# Patient Record
Sex: Female | Born: 1950 | Race: White | Hispanic: No | Marital: Married | Smoking: Never smoker
Health system: Southern US, Community
[De-identification: ages and names within clinical notes are randomized; demographics above are authoritative.]

## PROBLEM LIST (undated history)

## (undated) ENCOUNTER — Emergency Department (HOSPITAL_BASED_OUTPATIENT_CLINIC_OR_DEPARTMENT_OTHER): Admission: EM | Payer: Medicare Other

## (undated) DIAGNOSIS — M1990 Unspecified osteoarthritis, unspecified site: Secondary | ICD-10-CM

## (undated) DIAGNOSIS — I10 Essential (primary) hypertension: Secondary | ICD-10-CM

## (undated) DIAGNOSIS — E039 Hypothyroidism, unspecified: Secondary | ICD-10-CM

## (undated) DIAGNOSIS — K219 Gastro-esophageal reflux disease without esophagitis: Secondary | ICD-10-CM

## (undated) DIAGNOSIS — E7800 Pure hypercholesterolemia, unspecified: Secondary | ICD-10-CM

## (undated) DIAGNOSIS — N189 Chronic kidney disease, unspecified: Secondary | ICD-10-CM

## (undated) DIAGNOSIS — F32A Depression, unspecified: Secondary | ICD-10-CM

## (undated) DIAGNOSIS — K589 Irritable bowel syndrome without diarrhea: Secondary | ICD-10-CM

## (undated) DIAGNOSIS — F329 Major depressive disorder, single episode, unspecified: Secondary | ICD-10-CM

## (undated) DIAGNOSIS — K449 Diaphragmatic hernia without obstruction or gangrene: Secondary | ICD-10-CM

## (undated) DIAGNOSIS — K760 Fatty (change of) liver, not elsewhere classified: Secondary | ICD-10-CM

## (undated) DIAGNOSIS — K649 Unspecified hemorrhoids: Secondary | ICD-10-CM

## (undated) HISTORY — PX: OTHER SURGICAL HISTORY: SHX169

## (undated) HISTORY — DX: Diaphragmatic hernia without obstruction or gangrene: K44.9

---

## 1998-02-17 ENCOUNTER — Ambulatory Visit (HOSPITAL_COMMUNITY): Admission: RE | Admit: 1998-02-17 | Discharge: 1998-02-17 | Payer: Self-pay | Admitting: *Deleted

## 1998-02-17 ENCOUNTER — Encounter: Payer: Self-pay | Admitting: Family Medicine

## 1999-02-10 ENCOUNTER — Encounter: Payer: Self-pay | Admitting: *Deleted

## 1999-02-10 ENCOUNTER — Ambulatory Visit (HOSPITAL_COMMUNITY): Admission: RE | Admit: 1999-02-10 | Discharge: 1999-02-10 | Payer: Self-pay | Admitting: *Deleted

## 1999-08-18 ENCOUNTER — Ambulatory Visit (HOSPITAL_COMMUNITY): Admission: RE | Admit: 1999-08-18 | Discharge: 1999-08-18 | Payer: Self-pay | Admitting: Family Medicine

## 1999-08-18 ENCOUNTER — Encounter: Payer: Self-pay | Admitting: Family Medicine

## 1999-12-06 ENCOUNTER — Ambulatory Visit (HOSPITAL_BASED_OUTPATIENT_CLINIC_OR_DEPARTMENT_OTHER): Admission: RE | Admit: 1999-12-06 | Discharge: 1999-12-06 | Payer: Self-pay | Admitting: Orthopaedic Surgery

## 2000-03-16 ENCOUNTER — Observation Stay (HOSPITAL_COMMUNITY): Admission: EM | Admit: 2000-03-16 | Discharge: 2000-03-17 | Payer: Self-pay | Admitting: Podiatry

## 2000-03-17 ENCOUNTER — Encounter: Payer: Self-pay | Admitting: Internal Medicine

## 2003-02-05 ENCOUNTER — Ambulatory Visit (HOSPITAL_COMMUNITY): Admission: RE | Admit: 2003-02-05 | Discharge: 2003-02-05 | Payer: Self-pay | Admitting: Family Medicine

## 2003-03-26 ENCOUNTER — Encounter
Admission: RE | Admit: 2003-03-26 | Discharge: 2003-06-24 | Payer: Self-pay | Admitting: Physical Medicine & Rehabilitation

## 2003-04-10 ENCOUNTER — Encounter
Admission: RE | Admit: 2003-04-10 | Discharge: 2003-04-10 | Payer: Self-pay | Admitting: Physical Medicine & Rehabilitation

## 2003-07-23 ENCOUNTER — Other Ambulatory Visit: Admission: RE | Admit: 2003-07-23 | Discharge: 2003-07-23 | Payer: Self-pay | Admitting: Family Medicine

## 2005-01-12 ENCOUNTER — Other Ambulatory Visit: Admission: RE | Admit: 2005-01-12 | Discharge: 2005-01-12 | Payer: Self-pay | Admitting: Family Medicine

## 2007-09-23 ENCOUNTER — Emergency Department (HOSPITAL_BASED_OUTPATIENT_CLINIC_OR_DEPARTMENT_OTHER): Admission: EM | Admit: 2007-09-23 | Discharge: 2007-09-23 | Payer: Self-pay | Admitting: Emergency Medicine

## 2007-10-21 DIAGNOSIS — N12 Tubulo-interstitial nephritis, not specified as acute or chronic: Secondary | ICD-10-CM | POA: Insufficient documentation

## 2007-10-21 DIAGNOSIS — E7800 Pure hypercholesterolemia, unspecified: Secondary | ICD-10-CM | POA: Insufficient documentation

## 2007-10-21 DIAGNOSIS — K7689 Other specified diseases of liver: Secondary | ICD-10-CM | POA: Insufficient documentation

## 2007-10-21 DIAGNOSIS — I10 Essential (primary) hypertension: Secondary | ICD-10-CM

## 2007-10-21 DIAGNOSIS — E039 Hypothyroidism, unspecified: Secondary | ICD-10-CM | POA: Insufficient documentation

## 2007-10-21 DIAGNOSIS — R109 Unspecified abdominal pain: Secondary | ICD-10-CM | POA: Insufficient documentation

## 2007-10-22 ENCOUNTER — Ambulatory Visit: Payer: Self-pay | Admitting: Gastroenterology

## 2007-10-22 DIAGNOSIS — K589 Irritable bowel syndrome without diarrhea: Secondary | ICD-10-CM

## 2007-10-22 DIAGNOSIS — K219 Gastro-esophageal reflux disease without esophagitis: Secondary | ICD-10-CM

## 2007-10-22 DIAGNOSIS — R1011 Right upper quadrant pain: Secondary | ICD-10-CM | POA: Insufficient documentation

## 2007-10-22 DIAGNOSIS — R079 Chest pain, unspecified: Secondary | ICD-10-CM

## 2007-10-22 DIAGNOSIS — F4323 Adjustment disorder with mixed anxiety and depressed mood: Secondary | ICD-10-CM | POA: Insufficient documentation

## 2007-10-22 LAB — CONVERTED CEMR LAB: Tissue Transglutaminase Ab, IgA: 1.3 units (ref <7)

## 2007-10-23 ENCOUNTER — Telehealth: Payer: Self-pay | Admitting: Gastroenterology

## 2007-10-23 LAB — CONVERTED CEMR LAB
Folate: 8.8 ng/mL
TSH: 5.68 microintl units/mL — ABNORMAL HIGH (ref 0.35–5.50)
Vitamin B-12: 99 pg/mL — ABNORMAL LOW (ref 211–911)

## 2007-10-25 ENCOUNTER — Ambulatory Visit: Payer: Self-pay | Admitting: Gastroenterology

## 2007-10-25 DIAGNOSIS — E538 Deficiency of other specified B group vitamins: Secondary | ICD-10-CM | POA: Insufficient documentation

## 2007-10-30 ENCOUNTER — Ambulatory Visit: Payer: Self-pay | Admitting: Gastroenterology

## 2007-10-30 ENCOUNTER — Encounter: Payer: Self-pay | Admitting: Gastroenterology

## 2007-10-30 LAB — CONVERTED CEMR LAB: UREASE: NEGATIVE

## 2007-11-01 ENCOUNTER — Ambulatory Visit: Payer: Self-pay | Admitting: Gastroenterology

## 2007-11-05 ENCOUNTER — Encounter: Payer: Self-pay | Admitting: Gastroenterology

## 2007-11-08 ENCOUNTER — Ambulatory Visit: Payer: Self-pay | Admitting: Gastroenterology

## 2007-11-12 ENCOUNTER — Ambulatory Visit: Payer: Self-pay | Admitting: Gastroenterology

## 2007-12-13 ENCOUNTER — Ambulatory Visit: Payer: Self-pay | Admitting: Gastroenterology

## 2008-01-10 ENCOUNTER — Ambulatory Visit: Payer: Self-pay | Admitting: Gastroenterology

## 2008-02-06 ENCOUNTER — Ambulatory Visit: Payer: Self-pay | Admitting: Gastroenterology

## 2008-02-06 LAB — CONVERTED CEMR LAB
Folate: 5.5 ng/mL
Vitamin B-12: >1500 pg/mL — ABNORMAL HIGH (ref 211–911)

## 2009-08-17 ENCOUNTER — Ambulatory Visit: Payer: Self-pay | Admitting: Gastroenterology

## 2009-08-17 DIAGNOSIS — K625 Hemorrhage of anus and rectum: Secondary | ICD-10-CM

## 2009-08-17 DIAGNOSIS — K648 Other hemorrhoids: Secondary | ICD-10-CM | POA: Insufficient documentation

## 2009-08-18 LAB — CONVERTED CEMR LAB
Basophils Absolute: 0 10*3/uL (ref 0.0–0.1)
Basophils Relative: 0.3 % (ref 0.0–3.0)
Eosinophils Relative: 0.2 % (ref 0.0–5.0)
Folate: 11.7 ng/mL
HCT: 41.4 % (ref 36.0–46.0)
Hemoglobin: 14.2 g/dL (ref 12.0–15.0)
Iron: 105 ug/dL (ref 42–145)
Lymphocytes Relative: 24.5 % (ref 12.0–46.0)
Lymphs Abs: 1.9 10*3/uL (ref 0.7–4.0)
Monocytes Relative: 6.3 % (ref 3.0–12.0)
Neutro Abs: 5.2 10*3/uL (ref 1.4–7.7)
RBC: 4.48 M/uL (ref 3.87–5.11)
Saturation Ratios: 30.2 % (ref 20.0–50.0)
Transferrin: 248.3 mg/dL (ref 212.0–360.0)
WBC: 7.6 10*3/uL (ref 4.5–10.5)

## 2009-09-28 ENCOUNTER — Telehealth: Payer: Self-pay | Admitting: Gastroenterology

## 2010-03-01 NOTE — Progress Notes (Signed)
Summary: triage  Phone Note Call from Patient Call back at [phone]   Caller: Patient Call For: Dr. Jarold Motto Reason for Call: Talk to Nurse Summary of Call: hemorrhoids have worsened per pt and she would like to know what she should do Initial call taken by: Vallarie Mare,  September 28, 2009 2:02 PM  Follow-up for Phone Call        Pt was seen one month ago re hemorrhoid.  She has tried sitz baths and ananmantle cream.  No improvement noted.  Cont's to have rectal bleeding.  A few days ago passed alot of bright red blood.  Asking what else she can do? Follow-up by: Ashok Cordia RN,  September 28, 2009 2:34 PM    Additional Follow-up for Phone Call Additional follow up Details #2::    Canasa 1gm supp two times a day and schedule colon exam....phillip's colon health two times a day also Follow-up by: Mardella Layman MD Clementeen Graham,  September 28, 2009 3:15 PM  Additional Follow-up for Phone Call Additional follow up Details #3:: Details for Additional Follow-up Action Taken: LM for pt to call.  Lupita Leash Surface RN  September 29, 2009 9:18 AM   Pt notified.  She is going to try the canasa and colon health.  She will call back to sch colonoscopy if no improvement.  Just had conon in 2009, Additional Follow-up by: Ashok Cordia RN,  September 29, 2009 9:32 AM  New/Updated Medications: CANASA 1000 MG  SUPP (MESALAMINE) 1 per rectum two times a day PHILLIPS COLON HEALTH  CAPS (PROBIOTIC PRODUCT) two times a day Prescriptions: CANASA 1000 MG  SUPP (MESALAMINE) 1 per rectum two times a day  #30 x 3   Entered by:   Ashok Cordia RN   Authorized by:   Mardella Layman MD [facility]   Signed by:   Ashok Cordia RN on 09/29/2009   Method used:   [facility]  [place] [address]       [address] [place] [address]       [place], [place]  [address]       Ph: [phone] or [phone]       Fax: [phone]   RxID:   [id number]

## 2010-03-01 NOTE — Assessment & Plan Note (Signed)
Summary: hemorroids--ch.   History of Present Illness Visit Type: Follow-up Visit Primary GI MD: Sheryn Bison MD FACP FAGA Primary Provider: Lajuana Matte, [place] Chief Complaint: hemorrhoidal flare, inflamed & bleeding History of Present Illness:   Periodic rectal bleeding and rectal discomfort or palpated hemorrhoids for several years. Last colonoscopy was 2 years ago and was otherwise unremarkable. Patient voluntarily lost 80 pounds of weight and denies other gastrointestinal issues. Review of her lab shows previous iron deficiency, she has no symptoms of anemia.   GI Review of Systems      Denies abdominal pain, acid reflux, belching, bloating, chest pain, dysphagia with liquids, dysphagia with solids, heartburn, loss of appetite, nausea, vomiting, vomiting blood, weight loss, and  weight gain.      Reports hemorrhoids, rectal bleeding, and  rectal pain.     Denies anal fissure, black tarry stools, change in bowel habit, constipation, diarrhea, diverticulosis, fecal incontinence, heme positive stool, irritable bowel syndrome, jaundice, light color stool, and  liver problems.    Current Medications (verified): 1)  Lisinopril-Hydrochlorothiazide 10-12.5 Mg Tabs (Lisinopril-Hydrochlorothiazide) .... Once Daily 2)  Levothyroxine Sodium 50 Mcg Tabs (Levothyroxine Sodium) .... Once Daily 3)  Zoloft 50 Mg Tabs (Sertraline Hcl) .... One By Mouth Once Daily 4)  Celebrex 200 Mg Caps (Celecoxib) .... Take One By Mouth Two Times A Day  Allergies (verified): No Known Drug Allergies  Past History:  Past medical, surgical, family and social histories (including risk factors) reviewed for relevance to current acute and chronic problems.  Past Medical History: Reviewed history from 10/21/2007 and no changes required. Current Problems:  FATTY LIVER DISEASE (ICD-571.8) HYPOTHYROIDISM (ICD-244.9) HYPERCHOLESTEROLEMIA (ICD-272.0) HYPERTENSION (ICD-401.9) PYELONEPHRITIS (ICD-590.80) ABDOMINAL  PAIN (ICD-789.00)  Past Surgical History: Reviewed history from 10/22/2007 and no changes required. Anti-reflux surgery in 1999 at [facility] surgery. These records are not available at this time for review  Family History: Reviewed history from 02/06/2008 and no changes required. Family History of Heart Disease:  Father Family History of Diabetes: Mother Stroke: Stefanie Libel, Mother  Social History: Reviewed history from 10/22/2007 and no changes required. Patient has never smoked.  Alcohol Use - no Illicit Drug Use - no Occupation: Publishing copy Area Patient does not get regular exercise.  Daily Caffeine Use  Review of Systems  The patient denies allergy/sinus, anemia, anxiety-new, arthritis/joint pain, back pain, blood in urine, breast changes/lumps, change in vision, confusion, cough, coughing up blood, depression-new, fainting, fatigue, fever, headaches-new, hearing problems, heart murmur, heart rhythm changes, itching, menstrual pain, muscle pains/cramps, night sweats, nosebleeds, pregnancy symptoms, shortness of breath, skin rash, sleeping problems, sore throat, swelling of feet/legs, swollen lymph glands, thirst - excessive , urination - excessive , urination changes/pain, urine leakage, vision changes, and voice change.   General:  Denies fever, chills, sweats, anorexia, fatigue, weakness, malaise, weight loss, and sleep disorder. GI:  Denies difficulty swallowing, pain on swallowing, nausea, indigestion/heartburn, vomiting, vomiting blood, abdominal pain, jaundice, gas/bloating, diarrhea, constipation, change in bowel habits, bloody BM's, black BMs, and fecal incontinence. Psych:  Denies depression, anxiety, memory loss, suicidal ideation, hallucinations, paranoia, phobia, and confusion; she That is on Zoloft 50 mg a day.. Heme:  Denies bruising, bleeding, enlarged lymph nodes, and pagophagia.  Vital Signs:  Patient profile:   60 year old  female Height:      66 inches Weight:      155.38 pounds BMI:     25.17 Pulse rate:   68 / minute Pulse rhythm:   regular BP sitting:  114 / 78  (right arm) Cuff size:   regular  Vitals Entered By: June McMurray CMA Duncan Dull) (August 17, 2009 3:36 PM)  Physical Exam  General:  Well developed, well nourished, no acute distress.healthy appearing.   Head:  Normocephalic and atraumatic. Eyes:  PERRLA, no icterus.exam deferred to patient's ophthalmologist.   Abdomen:  Soft, nontender and nondistended. No masses, hepatosplenomegaly or hernias noted. Normal bowel sounds. Rectal:  Normal exam.hemocult negative.   Extremities:  No clubbing, cyanosis, edema or deformities noted. Neurologic:  Alert and  oriented x4;  grossly normal neurologically. Psych:  Alert and cooperative. Normal mood and affect.   Impression & Recommendations:  Problem # 1:  HEMORRHOIDS, INTERNAL (ICD-455.0) Assessment Unchanged p.r.n. Sitz baths and local Analmantle cream as needed with daily Benefiber as tolerated.  Problem # 2:  B12 DEFICIENCY (ICD-266.2) Assessment: Improved repeat CBC and anemia profile.  Problem # 3:  IRRITABLE BOWEL SYNDROME (ICD-564.1) Assessment: Improved She is up-to-date on her colonoscopy exams.  Other Orders: TLB-CBC Platelet - w/Differential (85025-CBCD) TLB-B12, Serum-Total ONLY (16109-U04) TLB-Ferritin (82728-FER) TLB-Folic Acid (Folate) (82746-FOL) TLB-IBC Pnl (Iron/FE;Transferrin) (83550-IBC)  Patient Instructions: 1)  Please go to the basement for lab work. 2)  Begin Benefiber daily. 3)  Begin ananmantle as directed. 4)  The medication list was reviewed and reconciled.  All changed / newly prescribed medications were explained.  A complete medication list was provided to the patient / caregiver. 5)  Copy sent to : Lajuana Matte, PA 6)  Please continue current medications.  7)  Constipation and Hemorrhoids brochure given.   Appended Document: hemorroids--ch.    Clinical  Lists Changes  Medications: Added new medication of ANALPRAM E 2.5-1 & 1 % KIT (HYDROCORTISONE ACE-PRAMOXINE) Apply two times a day as needed - Signed Added new medication of BENEFIBER  PACK (GUAR GUM) daily Rx of ANALPRAM E 2.5-1 & 1 % KIT (HYDROCORTISONE ACE-PRAMOXINE) Apply two times a day as needed;  #30 x 2;  Signed;  Entered by: Ashok Cordia RN;  Authorized by: Mardella Layman MD [facility];  Method used: [facility]  [place] [address], [place], [place]  [address], Ph: [phone] or [phone], Fax: [phone]    Prescriptions: ANALPRAM E 2.5-1 & 1 % KIT (HYDROCORTISONE ACE-PRAMOXINE) Apply two times a day as needed  #30 x 2   Entered by:   Ashok Cordia RN   Authorized by:   Mardella Layman MD [facility]   Signed by:   Ashok Cordia RN on 08/17/2009   Method used:   [facility]        [facility]  [place] [address]       [address]       [place], [place]  [address]       Ph: [phone] or [phone]       Fax: [phone]   RxID:   [id number]

## 2010-05-20 ENCOUNTER — Emergency Department (HOSPITAL_BASED_OUTPATIENT_CLINIC_OR_DEPARTMENT_OTHER)
Admission: EM | Admit: 2010-05-20 | Discharge: 2010-05-20 | Disposition: A | Discharge status: Home / Self Care | Payer: 59 | Attending: Emergency Medicine | Admitting: Emergency Medicine

## 2010-05-20 ENCOUNTER — Emergency Department (INDEPENDENT_AMBULATORY_CARE_PROVIDER_SITE_OTHER): Payer: 59

## 2010-05-20 DIAGNOSIS — E7800 Pure hypercholesterolemia, unspecified: Secondary | ICD-10-CM | POA: Insufficient documentation

## 2010-05-20 DIAGNOSIS — F410 Panic disorder [episodic paroxysmal anxiety] without agoraphobia: Secondary | ICD-10-CM | POA: Insufficient documentation

## 2010-05-20 DIAGNOSIS — R079 Chest pain, unspecified: Secondary | ICD-10-CM

## 2010-05-20 DIAGNOSIS — I10 Essential (primary) hypertension: Secondary | ICD-10-CM | POA: Insufficient documentation

## 2010-05-20 DIAGNOSIS — E039 Hypothyroidism, unspecified: Secondary | ICD-10-CM | POA: Insufficient documentation

## 2010-06-17 NOTE — Assessment & Plan Note (Signed)
HISTORY OF PRESENT ILLNESS:  Karen Horne returns to the clinic today for  follow up evaluation.  She has undergone the MRI scan of her cervical spine  dated from April 10, 2003.  The impression was small left posterolateral and  foraminal protrusion at C5-6.  There were also mild disk bulges at C4-5 and  C6-7 without cord distortion or foraminal encroachment.  The patient reports  that she did have some increased pain of her right arm and shoulder for a  couple of days after her MRI scan.  This may have been secondary to the  immobility and the positioning during the study.  In any event, that has  improved some degree.   The patient was prescribed hydrocodone 5/325 1 tablet q.8h.  Unfortunately,  she misunderstood and has only taken that one tablet a day.  In fact, on  some days, she has used no medications.  She does report that the trazodone  causes severe headaches, and she is not using that medication whatsoever at  this time.  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   MEDICATIONS:  1. Lisinopril/hydrochlorothiazide 20/12.5 1 tablet daily.  2. Hydrocodone 5/325 1 tablet p.o. daily.   PHYSICAL EXAMINATION:  GENERAL APPEARANCE:  Well-appearing adult female.  VITAL SIGNS:  Blood pressure 147/67, pulse 88, O2 saturation 96% on room  air.  EXTREMITIES:  She has strength of 5 minutes/5 throughout the bilateral upper  and lower extremities.  Bulk and tone were normal.  Reflexes were 2+ and  symmetrical.  Sensation was intact to light touch throughout.  The patient  ambulates without any assisted device.   IMPRESSION:  1. Cervical spondylosis per MRI study April 10, 2003.  2. History of bilateral knee arthroscopies with chronic pain.   PLAN:  Unfortunately, the patient was not able to tolerate the trazodone  medications.  She is using no medication for sleep at the present time.  I  have refilled her  Hydrocodone 5/325 1 tablet p.o. q.8h. as of May 01, 2003.  She understands now that she can use the medication up to three times per  day.  We will see how she does over the next few weeks and with future  evaluations of her right shoulder as necessary.      Ellwood Dense, M.D.   DC/MedQ  D:  04/20/2003 15:01:14  T:  04/21/2003 08:49:17  Job #:  [id number]

## 2010-06-17 NOTE — Op Note (Signed)
[name]  Patient:    Karen, Horne                        MRN: [id number] Proc. Date: 12/06/99 Adm. Date:  [id number] Attending:  Marcene Corning                           Operative Report  PREOPERATIVE DIAGNOSIS:  Right knee torn medial meniscus.  POSTOPERATIVE DIAGNOSES: 1. Right knee torn medial meniscus. 2. Right knee chondromalacia of the patella.  PROCEDURES: 1. Right knee partial medial meniscectomy. 2. Right knee chondroplasty, patella and medial femoral condyle.  ANESTHESIA:  Knee block.  SURGEON:  Lubertha Basque. Jerl Santos, M.D.  ASSISTANT:  Prince Rome, P.A.  INDICATION FOR PROCEDURE:  The patient is a 60 year old woman about six or seven months from a successful left knee arthroplasty.  She had similar symptoms on the right with an effusion and medial joint line pain.  Having failed conservative measures, she is offered operative arthroscopy.  The procedure was discussed with the patient, and informed operative consent was obtained after discussion of the possible complications of reaction to anesthesia and infection.  DESCRIPTION OF PROCEDURE:  The patient was taken to the operating suite, where knee block anesthetic was applied without difficulty.  She was positioned supine and prepped and draped in the normal sterile fashion.  After the administration of preoperative IV antibiotics, an arthroscopy of the right knee was performed through a total of two inferior portals.  Suprapatellar pouch was benign, while the patellofemoral joint did exhibit some grade 3 chondromalacia in the groove.  This was addressed with a brief chondroplasty. The patella tracked fairly well, and it was not felt that a lateral release was necessary.  In the medial compartment, she had a degenerative tear of the middle portion of the medial meniscus extending to the posterior horn.  A partial medial meniscectomy was performed back to stable tissues,  removing about 10% of the structure to a stable rim.  She also had some loose cartilage on the medial femoral condyle, which was very soft.  This was debrided, performing a chondroplasty of a nickel-size area back to stable tissues.  No bare bone was exposed.  The lateral compartment was completely benign with no evidence of meniscal or articular cartilage injury.  The ACL and PCL were intact.  The knee joint was thoroughly irrigated at the end of the case, followed by the placement of Marcaine with epinephrine and morphine.  Adaptic was placed over the two portals, followed by dry gauze and a loose Ace wrap. Estimated blood loss, intraoperative fluids can be obtained from anesthesia records.  DISPOSITION:  The patient was taken to the recovery room in stable condition . Plans were for her to go home the same day and follow up in the office in less than a week.  I will contact her by phone tonight. DD:  12/06/99 TD:  12/06/99 Job: [id number]

## 2010-06-17 NOTE — Group Therapy Note (Signed)
HISTORY OF PRESENT ILLNESS:  The patient is a 60 year old right-handed adult  female referred to this office for chronic pain management.  I have some  minimal medical records which accompany the patient.  Most of the  information is obtained from the patient herself.  She does report chronic  low back and upper back pain, dating to at least 45.  The patient reports  that she has received approximately two steroid injections in the past with  minimal relief.  She reports that she has been mostly treated through Dr.  Brynda Greathouse Harris's office, primarily with a nurse practitioner in that office.  The patient does have medical records indicating that she was seen by Dr.  Lubertha Basque. Dalldorf in March 2004.  At that time she was having shoulder and  elbow pain.  He diagnosed mild degenerative changes of the cervical spine at  C5-6, along with impingement of the left shoulder.  On July 02, 2002, the patient was seen by Dr. Meredith Staggers, another  physician in her primary care physician's office.  At that time she had left  leg pain.  They prescribed Bextra for approximately one week's time, and the  diagnosis was muscle strain of the left leg.  The patient has undergone a cervical spine plain film on February 05, 2003,  which reportedly showed degenerative changes at C5-6.  There was  consideration of an MRI study.  The patient reports that she did not want an  MRI study unless she could have it in an open scanner.  The patient reports that she has been tried on numerous medications in the  past, various anti-inflammatory medications along with Flexeril, Skelaxin  and Soma.  The Soma medication was too strong, and she reports no particular  benefit from the Flexeril or Skelaxin medications.  She reports that the  injections have given her minimal relief.  She has also tried Tylenol extra  strength arthritis, and again gained minimal relief.  She does use some  hydrocodone, but has used it  sparingly over the past year.  She has had some  increased pain recently, and has been using it a little bit more frequently  lately over the past month or so.  That does give her some relief at the  present time.  She has not tried Robaxin or trazodone medication in the  past.  The patient reports that her pain is mostly in her mid-thoracic region, and  that pain is unchanged.  She reports that it hurts to sit in a chair and  lean backwards towards the back of the chair.  She has increased pain in her  arms, and reports that it is difficult to hold her arms at her side or to  grab an object out at any distance from her body.  She reports that only  changing position gives her any relief at the present time.  She does have  some chronic knee pain, but that is essentially not what she is seeing [place] in  this office for.  That is related to prior problems with her knees, which  have been treated with arthroscopy.   REVIEW OF SYSTEMS:  Complains of fatigue and poor sleep.   PAST MEDICAL HISTORY:  1. Depression, secondary to her mother's death and also to her chronic pain.  2. History of left knee pain, treated with left medial meniscal surgery in     May 2001.  3. History of torn medial meniscus, right knee,  treated with arthroscopy.  4. Hypertension.  5. Prior Nissen fundoplication for reflux.   ALLERGIES:  No known drug allergies.   SOCIAL HISTORY:  The patient is married with one grown child.  She denies  alcohol or tobacco usage.  She does work at the [employer], and does some mostly secretarial work, although there is a  minimal amount of lifting involved.   FAMILY HISTORY:  Positive for stroke.   MEDICATIONS:  1. Lisinopril.  2. Hydrochlorothiazide 20/12.5 mg, one tab daily.  3. Hydrocodone 5/500 mg, one to two tab p.o. t.i.d. p.r.n.   PHYSICAL EXAMINATION:  GENERAL:  A well-appearing, slightly overweight adult  female seated in a chair, dressed  casually.  VITAL SIGNS:  Blood pressure measured at 144/80, pulse 86, O2 saturation 98%  on room air.  NEUROLOGIC:  The patient ambulates without any assistive device.  Upper  extremity exam shows normal bulk and tone throughout.  Reflexes 2+ and  symmetrical, and sensation intact to light touch throughout.  Strength was  generally 5-/5 throughout the bilateral upper extremities.  Cervical range  of motion showed complaints of pain without radiation.  Range of motion was  within functional limits.  The lower extremity exam was essentially  unremarkable with 5/5 strength throughout, with normal bulk and tone, and  normal reflexes.  There is mild crepitus of the bilateral knees with range  of motion passively.   IMPRESSION:  1. Chronic neck and upper extremity pain, diffuse.  2. History of bilateral knee arthroscopies with chronic pain.   PLAN:  At the present time, we have decided to proceed with an MRI scan of  her cervical spine, to evaluate, to see if there is any further need for any  surgical intervention.  She does have chronic pain dating back to at least  the early 1990's, if not prior to that.  I have asked her to start trazodone 50 mg one tab at night for an attempt to  improve her sleep.  We have also given her a refill on hydrocodone 5/325 mg,  one tab p.o. q.8h. p.r.n.  I have asked her to try to remain as active as  possible, and she essentially is doing so, as she continues to work outside  the home at the present time.  Hopefully we can get some level of pain  relief, although I doubt that there will  be a complete resolution of her  symptoms, now that they have gone on for greater than approximately 15  years.  I will plan on seeing the patient in followup in approximately three weeks'  time after the MRI scan of her cervical spine has been completed.     Ellwood Dense, M.D.   DC/MedQ  D:  03/30/2003 15:48:48  T:  03/30/2003 18:33:14  Job #:  [id number]  cc:    Holley Bouche, M.D.  [address]  [place], [place] [address]  Fax: [phone]

## 2010-06-17 NOTE — H&P (Signed)
[place]. [facility]  Patient:    Karen Horne, Karen Horne                        MRN: [id number] Adm. Date:  [id number] Disc. Date: [id number] Attending:  Marcene Corning                         History and Physical  CHIEF COMPLAINT: Chest pain x 1 hour.  HISTORY OF PRESENT ILLNESS: This patient is a 60 year old female who presents with acute substernal pressure chest pain radiating to her back, onset 6:30 p.m., associated with nausea, shortness of breath, and mild diaphoresis.  The chest pain came on while walking to her car at [employer].  It resolved in one hour without intervention.  She currently has back pain and feels like it is from lying on the stretcher, that radiates to her chest.  This improves with sitting up.  She was seen initially at the [facility] walk-in clinic and transferred to the emergency department for further evaluation.  She had one prior similar episode of back pain approximately two years that was felt to be musculoskeletal in nature.  She had a negative stress test approximately five years ago.  The patient was lifting a lot of boxes this week, but does not recall an injury.  Cholesterol has been borderline in the past.  She denies tobacco use.  She denies hypertension and denies diabetes.  Blood pressure upon arrival at the walk-in clinic was elevated at 180/130.  She did have a cortisone injection in her left knee two days ago.  PAST MEDICAL HISTORY:  1. Gastroesophageal reflux disease.  2. Anxiety with panic attacks ten years ago.  PAST SURGICAL HISTORY:  1. Bilateral knee arthroscopy in May 2001 and November 2001.  2. Fundoplication in 1995.  3. Bilateral tubal ligation.  CURRENT MEDICATIONS:  1. Zoloft 100 mg p.o. q.d.  2. Aleve p.r.n.  ALLERGIES: No known drug allergies.  SOCIAL HISTORY: The patient is married and has one daughter, one stepson, two grandchildren.  She works at [employer] at [employer] job.  She denies tobacco, alcohol,  or drug use.  FAMILY HISTORY: Positive for CVA in mother, grandmother, and grandfather; all in their 28s.  Hypertension in mother and father.  No diabetes, coronary artery disease, or cancer.  REVIEW OF SYSTEMS: Positive for left knee pain.  A fever blister developed today.  She does wear glasses for reading.  Otherwise the Review Of Systems is negative in all system groups.  PHYSICAL EXAMINATION:  VITAL SIGNS: Temperature 98.4 degrees, blood pressure 154/79, pulse 67, respirations 12.  Pulse oximetry 98% on room air.  GENERAL: The patient is a well-developed, well-nourished white female, in no acute distress.  HEENT: Tympanic membranes clear bilaterally.  Oropharynx clear.  Poor dentition.  PERRL.  Fundi within normal limits.  NECK: Supple.  No lymphadenopathy, no thyromegaly.  CARDIAC: Regular rate and rhythm, without murmur, S3 or S4.  LUNGS: Clear to auscultation.  ABDOMEN: Soft, nontender, nondistended.  Positive bowel sounds.  PELVIC/BREAST/RECTAL: Examinations are deferred.  EXTREMITIES: No edema.  Dorsalis pedis pulses 2+ bilaterally.  NEUROLOGIC: Cranial nerves 2-12 intact.  Strength 5/5 bilaterally in upper and lower extremities.  Reflexes unable to be elicited.  SKIN: Fever blister on upper lip, otherwise within normal limits.  BACK/CHEST: Tender to palpation.  LABORATORY DATA: EKG is within normal limits, unchanged from baseline preoperatively in November 2001.  WBC 7.7,  hemoglobin 12.5, 59% neutrophils.  CK 110 with MB fraction 3.2. Troponin I negative at less than 0.01.  Chest x-ray is within normal limits.  ASSESSMENT: This patient is a 60 year old female with minimal cardiac risk factors who presented with chest pain, with concern for possible cardiac ischemia.  Differential diagnosis also includes gastroesophageal reflux disease, anxiety, and musculoskeletal pain.  PLAN: Admit to telemetry bed, obtain serial CK-MBs and troponin Is, and EKG. Give  enteric-coated aspirin 325 mg p.o. q.d.  If the patient rules out she will need a stress test as an outpatient.  Denavir p.r.n. for the cold sore. DD:  03/16/00 TD:  03/17/00 Job: [id number]

## 2012-08-13 ENCOUNTER — Encounter: Payer: Self-pay | Admitting: Gastroenterology

## 2012-08-29 ENCOUNTER — Other Ambulatory Visit: Payer: Self-pay | Admitting: Orthopaedic Surgery

## 2012-09-10 ENCOUNTER — Encounter (HOSPITAL_COMMUNITY): Payer: Self-pay

## 2012-09-10 ENCOUNTER — Encounter (HOSPITAL_COMMUNITY)
Admission: RE | Admit: 2012-09-10 | Discharge: 2012-09-10 | Disposition: A | Discharge status: Home / Self Care | Payer: 59 | Source: Ambulatory Visit | Attending: Orthopaedic Surgery | Admitting: Orthopaedic Surgery

## 2012-09-10 DIAGNOSIS — Z01812 Encounter for preprocedural laboratory examination: Secondary | ICD-10-CM | POA: Insufficient documentation

## 2012-09-10 DIAGNOSIS — Z01818 Encounter for other preprocedural examination: Secondary | ICD-10-CM | POA: Insufficient documentation

## 2012-09-10 DIAGNOSIS — Z01810 Encounter for preprocedural cardiovascular examination: Secondary | ICD-10-CM | POA: Insufficient documentation

## 2012-09-10 HISTORY — DX: Unspecified hemorrhoids: K64.9

## 2012-09-10 HISTORY — DX: Essential (primary) hypertension: I10

## 2012-09-10 HISTORY — DX: Fatty (change of) liver, not elsewhere classified: K76.0

## 2012-09-10 HISTORY — DX: Gastro-esophageal reflux disease without esophagitis: K21.9

## 2012-09-10 HISTORY — DX: Major depressive disorder, single episode, unspecified: F32.9

## 2012-09-10 HISTORY — DX: Depression, unspecified: F32.A

## 2012-09-10 HISTORY — DX: Irritable bowel syndrome, unspecified: K58.9

## 2012-09-10 HISTORY — DX: Pure hypercholesterolemia, unspecified: E78.00

## 2012-09-10 HISTORY — DX: Hypothyroidism, unspecified: E03.9

## 2012-09-10 HISTORY — DX: Chronic kidney disease, unspecified: N18.9

## 2012-09-10 HISTORY — DX: Unspecified osteoarthritis, unspecified site: M19.90

## 2012-09-10 LAB — URINALYSIS, ROUTINE W REFLEX MICROSCOPIC
Glucose, UA: NEGATIVE mg/dL
Hgb urine dipstick: NEGATIVE
Ketones, ur: NEGATIVE mg/dL
Protein, ur: NEGATIVE mg/dL

## 2012-09-10 LAB — COMPREHENSIVE METABOLIC PANEL
CO2: 25 mEq/L (ref 19–32)
Calcium: 9.8 mg/dL (ref 8.4–10.5)
Creatinine, Ser: 0.83 mg/dL (ref 0.50–1.10)
GFR calc Af Amer: 85 mL/min — ABNORMAL LOW (ref >90)
GFR calc non Af Amer: 73 mL/min — ABNORMAL LOW (ref >90)
Glucose, Bld: 104 mg/dL — ABNORMAL HIGH (ref 70–99)

## 2012-09-10 LAB — CBC WITH DIFFERENTIAL/PLATELET
Basophils Relative: 0 % (ref 0–1)
Hemoglobin: 14.5 g/dL (ref 12.0–15.0)
Lymphocytes Relative: 31 % (ref 12–46)
MCHC: 34.7 g/dL (ref 30.0–36.0)
Monocytes Relative: 7 % (ref 3–12)
Neutro Abs: 3.9 10*3/uL (ref 1.7–7.7)
Neutrophils Relative %: 61 % (ref 43–77)
RBC: 4.63 MIL/uL (ref 3.87–5.11)
WBC: 6.4 10*3/uL (ref 4.0–10.5)

## 2012-09-10 LAB — URINE MICROSCOPIC-ADD ON

## 2012-09-10 LAB — SURGICAL PCR SCREEN: Staphylococcus aureus: NEGATIVE

## 2012-09-10 LAB — APTT: aPTT: 32 seconds (ref 24–37)

## 2012-09-10 LAB — TYPE AND SCREEN: ABO/RH(D): A POS

## 2012-09-10 LAB — PROTIME-INR: INR: 0.94 (ref 0.00–1.49)

## 2012-09-10 NOTE — Pre-Procedure Instructions (Signed)
Karen Horne  09/10/2012   Your procedure is scheduled on:  09-17-2012   Tuesday   Report to [facility] at 5:30 AM.  Call this number if you have problems the morning of surgery: [phone]   Remember:   Do not eat food or drink liquids after midnight.    Take these medicines the morning of surgery with A SIP OF WATER: levothyroxine   Do not wear jewelry, make-up or nail polish.  Do not wear lotions, powders, or perfumes.   Do not shave 48 hours prior to surgery.   Do not bring valuables to the hospital.  [facility] is not responsible for any belongings or valuables.  Contacts, dentures or bridgework may not be worn into surgery.  Leave suitcase in the car. After surgery it may be brought to your room.   For patients admitted to the hospital, checkout time is 11:00 AM the day of discharge.   Patients discharged the day of surgery will not be allowed to drive home.    Special Instructions: Shower using CHG 2 nights before surgery and the night before surgery.  If you shower the day of surgery use CHG.  Use special wash - you have one bottle of CHG for all showers.  You should use approximately 1/3 of the bottle for each shower.   Please read over the following fact sheets that you were given: Pain Booklet, Coughing and Deep Breathing, Blood Transfusion Information and Surgical Site Infection Prevention

## 2012-09-11 NOTE — H&P (Signed)
TOTAL KNEE ADMISSION H&P  Patient is being admitted for left total knee arthroplasty.  Subjective:  Chief Complaint:left knee pain.  HPI: Karen Horne, 62 y.o. female, has a history of pain and functional disability in the left knee due to arthritis and has failed non-surgical conservative treatments for greater than 12 weeks to includeNSAID's and/or analgesics, corticosteriod injections, supervised PT with diminished ADL's post treatment, weight reduction as appropriate and activity modification.  Onset of symptoms was gradual, starting 7 years ago with gradually worsening course since that time. The patient noted prior procedures on the knee to include  arthroscopy on the left knee(s).  Patient currently rates pain in the left knee(s) at 8 out of 10 with activity. Patient has night pain, worsening of pain with activity and weight bearing, pain that interferes with activities of daily living, pain with passive range of motion, crepitus and joint swelling.  Patient has evidence of subchondral sclerosis, periarticular osteophytes and joint space narrowing by imaging studies. This patient has had scope. There is no active infection.  Patient Active Problem List   Diagnosis Date Noted  . HEMORRHOIDS, INTERNAL 08/17/2009  . HEMORRHAGE OF RECTUM AND ANUS 08/17/2009  . B12 DEFICIENCY 10/25/2007  . ADJ DISORDER WITH MIXED ANXIETY & DEPRESSED MOOD 10/22/2007  . GERD 10/22/2007  . IRRITABLE BOWEL SYNDROME 10/22/2007  . CHEST PAIN 10/22/2007  . ABDOMINAL PAIN RIGHT UPPER QUADRANT 10/22/2007  . HYPOTHYROIDISM 10/21/2007  . HYPERCHOLESTEROLEMIA 10/21/2007  . HYPERTENSION 10/21/2007  . FATTY LIVER DISEASE 10/21/2007  . PYELONEPHRITIS 10/21/2007  . ABDOMINAL PAIN 10/21/2007   Past Medical History  Diagnosis Date  . Hypothyroidism   . Hypertension   . Chronic kidney disease pyelonephritis  . Hypercholesterolemia   . IBS (irritable bowel syndrome)   . Hemorrhoids   . Fatty liver   . Depression    . GERD (gastroesophageal reflux disease)     surgery for acid reflux 10 yrs ago  . Arthritis     Past Surgical History  Procedure Laterality Date  . Arthroscopic knee Left   . Reflux surgery      No prescriptions prior to admission   Allergies  Allergen Reactions  . Adhesive [Tape] Rash    History  Substance Use Topics  . Smoking status: Never Smoker   . Smokeless tobacco: Not on file  . Alcohol Use: No    No family history on file.   Review of Systems  Constitutional: Negative.   HENT: Negative.   Eyes: Negative.   Respiratory: Negative.   Cardiovascular: Negative.   Gastrointestinal: Negative.   Genitourinary: Negative.   Musculoskeletal: Positive for joint pain.  Skin: Negative.   Neurological: Negative.   Endo/Heme/Allergies: Negative.   Psychiatric/Behavioral: Negative.     Objective:  Physical Exam  Constitutional: She is oriented to person, place, and time. She appears well-developed.  HENT:  Head: Normocephalic.  Eyes: Pupils are equal, round, and reactive to light.  Neck: Neck supple.  Cardiovascular: Regular rhythm.   Respiratory: Breath sounds normal.  GI: Bowel sounds are normal.  Musculoskeletal:  Left knee exam: Tracy effusion.  Range of motion 15-80.  Good neurovascular status.  Crepitation 1+.  Pain along the medial joint line.  Calf soft and nontender negative Homans.  Neurological: She is oriented to person, place, and time.  Skin: Skin is dry.  Psychiatric: She has a normal mood and affect.    Vital signs in last 24 hours:    Labs:   Estimated body mass index  is 25.09 kg/(m^2) as calculated from the following:   Height as of 08/17/09: 5\' 6"  (1.676 m).   Weight as of 08/17/09: 70.48 kg (155 lb 6.1 oz).   Imaging Review Plain radiographs demonstrate severe degenerative joint disease of the left knee(s). The overall alignment isneutral. The bone quality appears to be good for age and reported activity  level.  Assessment/Plan:  End stage arthritis, left knee   The patient history, physical examination, clinical judgment of the provider and imaging studies are consistent with end stage degenerative joint disease of the left knee(s) and total knee arthroplasty is deemed medically necessary. The treatment options including medical management, injection therapy arthroscopy and arthroplasty were discussed at length. The risks and benefits of total knee arthroplasty were presented and reviewed. The risks due to aseptic loosening, infection, stiffness, patella tracking problems, thromboembolic complications and other imponderables were discussed. The patient acknowledged the explanation, agreed to proceed with the plan and consent was signed. Patient is being admitted for inpatient treatment for surgery, pain control, PT, OT, prophylactic antibiotics, VTE prophylaxis, progressive ambulation and ADL's and discharge planning. The patient is planning to be discharged home with home health services

## 2012-09-16 MED ORDER — LACTATED RINGERS IV SOLN: INTRAVENOUS | Status: DC

## 2012-09-16 MED ORDER — CHLORHEXIDINE GLUCONATE 4 % EX LIQD: 60.0000 mL | Freq: Once | CUTANEOUS | Status: DC

## 2012-09-16 MED ORDER — CEFAZOLIN SODIUM-DEXTROSE 2-3 GM-% IV SOLR
2.0000 g | INTRAVENOUS | Status: AC
  Administered 2012-09-17: 2 g via INTRAVENOUS
  Filled 2012-09-16: qty 50

## 2012-09-17 ENCOUNTER — Encounter (HOSPITAL_COMMUNITY)
Admission: RE | Disposition: A | Discharge status: Home Health | Payer: Self-pay | Source: Ambulatory Visit | Attending: Orthopaedic Surgery

## 2012-09-17 ENCOUNTER — Encounter (HOSPITAL_COMMUNITY): Payer: Self-pay | Admitting: Anesthesiology

## 2012-09-17 ENCOUNTER — Inpatient Hospital Stay (HOSPITAL_COMMUNITY): Payer: 59 | Admitting: Anesthesiology

## 2012-09-17 ENCOUNTER — Inpatient Hospital Stay (HOSPITAL_COMMUNITY)
Admission: RE | Admit: 2012-09-17 | Discharge: 2012-09-19 | DRG: 470 | Disposition: A | Discharge status: Home Health | Payer: 59 | Source: Ambulatory Visit | Attending: Orthopaedic Surgery | Admitting: Orthopaedic Surgery

## 2012-09-17 DIAGNOSIS — F3289 Other specified depressive episodes: Secondary | ICD-10-CM | POA: Diagnosis present

## 2012-09-17 DIAGNOSIS — M1710 Unilateral primary osteoarthritis, unspecified knee: Principal | ICD-10-CM | POA: Diagnosis present

## 2012-09-17 DIAGNOSIS — E7800 Pure hypercholesterolemia, unspecified: Secondary | ICD-10-CM | POA: Diagnosis present

## 2012-09-17 DIAGNOSIS — Z7982 Long term (current) use of aspirin: Secondary | ICD-10-CM

## 2012-09-17 DIAGNOSIS — K219 Gastro-esophageal reflux disease without esophagitis: Secondary | ICD-10-CM | POA: Diagnosis present

## 2012-09-17 DIAGNOSIS — Z79899 Other long term (current) drug therapy: Secondary | ICD-10-CM

## 2012-09-17 DIAGNOSIS — K7689 Other specified diseases of liver: Secondary | ICD-10-CM | POA: Diagnosis present

## 2012-09-17 DIAGNOSIS — N189 Chronic kidney disease, unspecified: Secondary | ICD-10-CM | POA: Diagnosis present

## 2012-09-17 DIAGNOSIS — K589 Irritable bowel syndrome without diarrhea: Secondary | ICD-10-CM | POA: Diagnosis present

## 2012-09-17 DIAGNOSIS — M1712 Unilateral primary osteoarthritis, left knee: Secondary | ICD-10-CM | POA: Diagnosis present

## 2012-09-17 DIAGNOSIS — E039 Hypothyroidism, unspecified: Secondary | ICD-10-CM | POA: Diagnosis present

## 2012-09-17 DIAGNOSIS — F329 Major depressive disorder, single episode, unspecified: Secondary | ICD-10-CM | POA: Diagnosis present

## 2012-09-17 DIAGNOSIS — I129 Hypertensive chronic kidney disease with stage 1 through stage 4 chronic kidney disease, or unspecified chronic kidney disease: Secondary | ICD-10-CM | POA: Diagnosis present

## 2012-09-17 DIAGNOSIS — E538 Deficiency of other specified B group vitamins: Secondary | ICD-10-CM | POA: Diagnosis present

## 2012-09-17 HISTORY — PX: TOTAL KNEE ARTHROPLASTY: SHX125

## 2012-09-17 SURGERY — ARTHROPLASTY, KNEE, TOTAL
Anesthesia: Regional | Site: Knee | Laterality: Left | Wound class: Clean

## 2012-09-17 MED ORDER — VENLAFAXINE HCL ER 37.5 MG PO CP24
37.5000 mg | ORAL_CAPSULE | Freq: Every day | ORAL | Status: DC
  Administered 2012-09-17 – 2012-09-19 (×3): 37.5 mg via ORAL
  Filled 2012-09-17 (×3): qty 1

## 2012-09-17 MED ORDER — PHENOL 1.4 % MT LIQD: 1.0000 | OROMUCOSAL | Status: DC | PRN

## 2012-09-17 MED ORDER — ASPIRIN EC 325 MG PO TBEC
325.0000 mg | DELAYED_RELEASE_TABLET | Freq: Two times a day (BID) | ORAL | Status: DC
  Administered 2012-09-18 – 2012-09-19 (×3): 325 mg via ORAL
  Filled 2012-09-17 (×5): qty 1

## 2012-09-17 MED ORDER — LISINOPRIL-HYDROCHLOROTHIAZIDE 20-12.5 MG PO TABS: 1.0000 | ORAL_TABLET | Freq: Every day | ORAL | Status: DC

## 2012-09-17 MED ORDER — DOCUSATE SODIUM 100 MG PO CAPS
100.0000 mg | ORAL_CAPSULE | Freq: Two times a day (BID) | ORAL | Status: DC
  Administered 2012-09-17 – 2012-09-19 (×5): 100 mg via ORAL
  Filled 2012-09-17 (×6): qty 1

## 2012-09-17 MED ORDER — LACTATED RINGERS IV SOLN: INTRAVENOUS | Status: DC

## 2012-09-17 MED ORDER — METOCLOPRAMIDE HCL 10 MG PO TABS: 5.0000 mg | ORAL_TABLET | Freq: Three times a day (TID) | ORAL | Status: DC | PRN

## 2012-09-17 MED ORDER — LACTATED RINGERS IV SOLN
INTRAVENOUS | Status: DC | PRN
  Administered 2012-09-17 (×2): via INTRAVENOUS

## 2012-09-17 MED ORDER — CEFAZOLIN SODIUM-DEXTROSE 2-3 GM-% IV SOLR
2.0000 g | Freq: Four times a day (QID) | INTRAVENOUS | Status: AC
  Administered 2012-09-17 (×2): 2 g via INTRAVENOUS
  Filled 2012-09-17 (×3): qty 50

## 2012-09-17 MED ORDER — HYDROCODONE-ACETAMINOPHEN 7.5-325 MG PO TABS
1.0000 | ORAL_TABLET | ORAL | Status: DC | PRN
  Administered 2012-09-17 – 2012-09-18 (×3): 2 via ORAL
  Filled 2012-09-17 (×3): qty 2

## 2012-09-17 MED ORDER — PROMETHAZINE HCL 25 MG/ML IJ SOLN
INTRAMUSCULAR | Status: AC
  Filled 2012-09-17: qty 1

## 2012-09-17 MED ORDER — HYDROMORPHONE HCL PF 1 MG/ML IJ SOLN
0.2500 mg | INTRAMUSCULAR | Status: DC | PRN
  Administered 2012-09-17 (×4): 0.5 mg via INTRAVENOUS

## 2012-09-17 MED ORDER — PROPOFOL 10 MG/ML IV BOLUS
INTRAVENOUS | Status: DC | PRN
  Administered 2012-09-17: 70 mg via INTRAVENOUS

## 2012-09-17 MED ORDER — FENTANYL CITRATE 0.05 MG/ML IJ SOLN
INTRAMUSCULAR | Status: DC | PRN
  Administered 2012-09-17 (×3): 50 ug via INTRAVENOUS
  Administered 2012-09-17 (×3): 100 ug via INTRAVENOUS
  Administered 2012-09-17: 50 ug via INTRAVENOUS

## 2012-09-17 MED ORDER — PROMETHAZINE HCL 25 MG/ML IJ SOLN
6.2500 mg | INTRAMUSCULAR | Status: DC | PRN
  Administered 2012-09-17: 6.25 mg via INTRAVENOUS

## 2012-09-17 MED ORDER — BUPIVACAINE IN DEXTROSE 0.75-8.25 % IT SOLN
INTRATHECAL | Status: DC | PRN
  Administered 2012-09-17: 12 mg via INTRATHECAL

## 2012-09-17 MED ORDER — ACETAMINOPHEN 650 MG RE SUPP: 650.0000 mg | Freq: Four times a day (QID) | RECTAL | Status: DC | PRN

## 2012-09-17 MED ORDER — MIDAZOLAM HCL 5 MG/5ML IJ SOLN
INTRAMUSCULAR | Status: DC | PRN
  Administered 2012-09-17 (×2): 2 mg via INTRAVENOUS

## 2012-09-17 MED ORDER — OXYCODONE HCL 5 MG PO TABS: 5.0000 mg | ORAL_TABLET | Freq: Once | ORAL | Status: DC | PRN

## 2012-09-17 MED ORDER — HYDROMORPHONE HCL PF 1 MG/ML IJ SOLN
0.5000 mg | INTRAMUSCULAR | Status: DC | PRN
  Administered 2012-09-17: 1 mg via INTRAVENOUS
  Filled 2012-09-17: qty 1

## 2012-09-17 MED ORDER — HYDROMORPHONE HCL PF 1 MG/ML IJ SOLN
INTRAMUSCULAR | Status: AC
  Filled 2012-09-17: qty 2

## 2012-09-17 MED ORDER — LISINOPRIL 20 MG PO TABS
20.0000 mg | ORAL_TABLET | Freq: Every day | ORAL | Status: DC
  Administered 2012-09-18: 20 mg via ORAL
  Filled 2012-09-17 (×2): qty 1

## 2012-09-17 MED ORDER — ZOLPIDEM TARTRATE 5 MG PO TABS: 5.0000 mg | ORAL_TABLET | Freq: Every evening | ORAL | Status: DC | PRN

## 2012-09-17 MED ORDER — HYDROCHLOROTHIAZIDE 12.5 MG PO CAPS
12.5000 mg | ORAL_CAPSULE | Freq: Every day | ORAL | Status: DC
  Administered 2012-09-18: 12.5 mg via ORAL
  Filled 2012-09-17 (×2): qty 1

## 2012-09-17 MED ORDER — LIOTHYRONINE SODIUM 25 MCG PO TABS
75.0000 ug | ORAL_TABLET | Freq: Every day | ORAL | Status: DC
  Administered 2012-09-18 – 2012-09-19 (×2): 75 ug via ORAL
  Filled 2012-09-17 (×3): qty 3

## 2012-09-17 MED ORDER — MENTHOL 3 MG MT LOZG: 1.0000 | LOZENGE | OROMUCOSAL | Status: DC | PRN

## 2012-09-17 MED ORDER — DEXAMETHASONE SODIUM PHOSPHATE 4 MG/ML IJ SOLN
INTRAMUSCULAR | Status: DC | PRN
  Administered 2012-09-17: 4 mg

## 2012-09-17 MED ORDER — PHENYLEPHRINE HCL 10 MG/ML IJ SOLN
INTRAMUSCULAR | Status: DC | PRN
  Administered 2012-09-17: 80 ug via INTRAVENOUS

## 2012-09-17 MED ORDER — METOCLOPRAMIDE HCL 5 MG/ML IJ SOLN: 5.0000 mg | Freq: Three times a day (TID) | INTRAMUSCULAR | Status: DC | PRN

## 2012-09-17 MED ORDER — ONDANSETRON HCL 4 MG/2ML IJ SOLN
INTRAMUSCULAR | Status: DC | PRN
  Administered 2012-09-17: 4 mg via INTRAVENOUS

## 2012-09-17 MED ORDER — TRANEXAMIC ACID 100 MG/ML IV SOLN
1000.0000 mg | INTRAVENOUS | Status: DC
  Filled 2012-09-17: qty 10

## 2012-09-17 MED ORDER — OXYCODONE HCL 5 MG/5ML PO SOLN: 5.0000 mg | Freq: Once | ORAL | Status: DC | PRN

## 2012-09-17 MED ORDER — METHOCARBAMOL 500 MG PO TABS
500.0000 mg | ORAL_TABLET | Freq: Four times a day (QID) | ORAL | Status: DC | PRN
  Administered 2012-09-18: 500 mg via ORAL
  Filled 2012-09-17: qty 1

## 2012-09-17 MED ORDER — DIPHENHYDRAMINE HCL 12.5 MG/5ML PO ELIX: 12.5000 mg | ORAL_SOLUTION | ORAL | Status: DC | PRN

## 2012-09-17 MED ORDER — METHOCARBAMOL 100 MG/ML IJ SOLN: 500.0000 mg | Freq: Four times a day (QID) | INTRAVENOUS | Status: DC | PRN

## 2012-09-17 MED ORDER — ONDANSETRON HCL 4 MG/2ML IJ SOLN: 4.0000 mg | Freq: Four times a day (QID) | INTRAMUSCULAR | Status: DC | PRN

## 2012-09-17 MED ORDER — ALUM & MAG HYDROXIDE-SIMETH 200-200-20 MG/5ML PO SUSP
30.0000 mL | ORAL | Status: DC | PRN
  Administered 2012-09-17: 30 mL via ORAL
  Filled 2012-09-17: qty 30

## 2012-09-17 MED ORDER — ATORVASTATIN CALCIUM 40 MG PO TABS
40.0000 mg | ORAL_TABLET | Freq: Every day | ORAL | Status: DC
  Administered 2012-09-17 – 2012-09-18 (×2): 40 mg via ORAL
  Filled 2012-09-17 (×3): qty 1

## 2012-09-17 MED ORDER — BUPIVACAINE-EPINEPHRINE PF 0.5-1:200000 % IJ SOLN
INTRAMUSCULAR | Status: DC | PRN
  Administered 2012-09-17: 50 mg

## 2012-09-17 MED ORDER — ONDANSETRON HCL 4 MG/2ML IJ SOLN
INTRAMUSCULAR | Status: AC
  Filled 2012-09-17: qty 2

## 2012-09-17 MED ORDER — ONDANSETRON HCL 4 MG PO TABS
4.0000 mg | ORAL_TABLET | Freq: Four times a day (QID) | ORAL | Status: DC | PRN
  Administered 2012-09-19: 4 mg via ORAL
  Filled 2012-09-17: qty 1

## 2012-09-17 MED ORDER — ACETAMINOPHEN 325 MG PO TABS
650.0000 mg | ORAL_TABLET | Freq: Four times a day (QID) | ORAL | Status: DC | PRN
  Administered 2012-09-19: 650 mg via ORAL
  Filled 2012-09-17: qty 2

## 2012-09-17 MED ORDER — SODIUM CHLORIDE 0.9 % IR SOLN
Status: DC | PRN
  Administered 2012-09-17: 3000 mL

## 2012-09-17 MED ORDER — 0.9 % SODIUM CHLORIDE (POUR BTL) OPTIME
TOPICAL | Status: DC | PRN
  Administered 2012-09-17: 1000 mL

## 2012-09-17 MED ORDER — BISACODYL 5 MG PO TBEC: 5.0000 mg | DELAYED_RELEASE_TABLET | Freq: Every day | ORAL | Status: DC | PRN

## 2012-09-17 SURGICAL SUPPLY — 65 items
BANDAGE ELASTIC 4 VELCRO ST LF (GAUZE/BANDAGES/DRESSINGS) ×2 IMPLANT
BANDAGE ELASTIC 6 VELCRO ST LF (GAUZE/BANDAGES/DRESSINGS) ×1 IMPLANT
BANDAGE ESMARK 6X9 LF (GAUZE/BANDAGES/DRESSINGS) ×1 IMPLANT
BANDAGE GAUZE ELAST BULKY 4 IN (GAUZE/BANDAGES/DRESSINGS) ×3 IMPLANT
BLADE SAGITTAL 25.0X1.19X90 (BLADE) ×2 IMPLANT
BLADE SURG ROTATE 9660 (MISCELLANEOUS) IMPLANT
BNDG CMPR 9X6 STRL LF SNTH (GAUZE/BANDAGES/DRESSINGS) ×1
BNDG CMPR MED 10X6 ELC LF (GAUZE/BANDAGES/DRESSINGS) ×1
BNDG ELASTIC 6X10 VLCR STRL LF (GAUZE/BANDAGES/DRESSINGS) ×2 IMPLANT
BNDG ESMARK 6X9 LF (GAUZE/BANDAGES/DRESSINGS) ×2
BOWL SMART MIX CTS (DISPOSABLE) ×2 IMPLANT
CAPT RP KNEE ×1 IMPLANT
CEMENT HV SMART SET (Cement) ×3 IMPLANT
CLOTH BEACON ORANGE TIMEOUT ST (SAFETY) ×2 IMPLANT
COVER SURGICAL LIGHT HANDLE (MISCELLANEOUS) ×2 IMPLANT
CUFF TOURNIQUET SINGLE 34IN LL (TOURNIQUET CUFF) ×2 IMPLANT
CUFF TOURNIQUET SINGLE 44IN (TOURNIQUET CUFF) IMPLANT
DRAPE EXTREMITY T 121X128X90 (DRAPE) ×2 IMPLANT
DRAPE PROXIMA HALF (DRAPES) ×2 IMPLANT
DRAPE U-SHAPE 47X51 STRL (DRAPES) ×2 IMPLANT
DRSG ADAPTIC 3X8 NADH LF (GAUZE/BANDAGES/DRESSINGS) ×2 IMPLANT
DRSG PAD ABDOMINAL 8X10 ST (GAUZE/BANDAGES/DRESSINGS) ×2 IMPLANT
DURAPREP 26ML APPLICATOR (WOUND CARE) ×2 IMPLANT
ELECT REM PT RETURN 9FT ADLT (ELECTROSURGICAL) ×2
ELECTRODE REM PT RTRN 9FT ADLT (ELECTROSURGICAL) ×1 IMPLANT
FACESHIELD LNG OPTICON STERILE (SAFETY) ×4 IMPLANT
GLOVE BIO SURGEON STRL SZ8.5 (GLOVE) ×2 IMPLANT
GLOVE BIOGEL PI IND STRL 8 (GLOVE) ×1 IMPLANT
GLOVE BIOGEL PI IND STRL 8.5 (GLOVE) ×1 IMPLANT
GLOVE BIOGEL PI INDICATOR 8 (GLOVE) ×1
GLOVE BIOGEL PI INDICATOR 8.5 (GLOVE) ×1
GLOVE SS BIOGEL STRL SZ 8 (GLOVE) ×1 IMPLANT
GLOVE SUPERSENSE BIOGEL SZ 8 (GLOVE) ×1
GOWN PREVENTION PLUS XLARGE (GOWN DISPOSABLE) ×2 IMPLANT
GOWN PREVENTION PLUS XXLARGE (GOWN DISPOSABLE) ×2 IMPLANT
GOWN STRL NON-REIN LRG LVL3 (GOWN DISPOSABLE) ×2 IMPLANT
HANDPIECE INTERPULSE COAX TIP (DISPOSABLE) ×2
HOOD PEEL AWAY FACE SHEILD DIS (HOOD) ×2 IMPLANT
IMMOBILIZER KNEE 20 (SOFTGOODS)
IMMOBILIZER KNEE 20 THIGH 36 (SOFTGOODS) IMPLANT
IMMOBILIZER KNEE 22 UNIV (SOFTGOODS) ×2 IMPLANT
IMMOBILIZER KNEE 24 THIGH 36 (MISCELLANEOUS) IMPLANT
IMMOBILIZER KNEE 24 UNIV (MISCELLANEOUS)
KIT BASIN OR (CUSTOM PROCEDURE TRAY) ×2 IMPLANT
KIT ROOM TURNOVER OR (KITS) ×2 IMPLANT
MANIFOLD NEPTUNE II (INSTRUMENTS) ×2 IMPLANT
NS IRRIG 1000ML POUR BTL (IV SOLUTION) ×2 IMPLANT
PACK TOTAL JOINT (CUSTOM PROCEDURE TRAY) ×2 IMPLANT
PAD ARMBOARD 7.5X6 YLW CONV (MISCELLANEOUS) ×4 IMPLANT
PIN FIXATION 1/8DIA X 3INL (PIN) ×1 IMPLANT
SET HNDPC FAN SPRY TIP SCT (DISPOSABLE) ×1 IMPLANT
SPONGE GAUZE 4X4 12PLY (GAUZE/BANDAGES/DRESSINGS) ×2 IMPLANT
STAPLER VISISTAT 35W (STAPLE) IMPLANT
STRIP CLOSURE SKIN 1/2X4 (GAUZE/BANDAGES/DRESSINGS) ×1 IMPLANT
SUCTION FRAZIER TIP 10 FR DISP (SUCTIONS) ×1 IMPLANT
SUT MNCRL AB 3-0 PS2 18 (SUTURE) ×1 IMPLANT
SUT VIC AB 0 CT1 27 (SUTURE) ×4
SUT VIC AB 0 CT1 27XBRD ANBCTR (SUTURE) ×2 IMPLANT
SUT VIC AB 2-0 CT1 27 (SUTURE) ×4
SUT VIC AB 2-0 CT1 TAPERPNT 27 (SUTURE) ×2 IMPLANT
SUT VLOC 180 0 24IN GS25 (SUTURE) ×2 IMPLANT
TOWEL OR 17X24 6PK STRL BLUE (TOWEL DISPOSABLE) ×2 IMPLANT
TOWEL OR 17X26 10 PK STRL BLUE (TOWEL DISPOSABLE) ×2 IMPLANT
TRAY FOLEY CATH 16FRSI W/METER (SET/KITS/TRAYS/PACK) ×2 IMPLANT
WATER STERILE IRR 1000ML POUR (IV SOLUTION) ×4 IMPLANT

## 2012-09-17 NOTE — Anesthesia Preprocedure Evaluation (Addendum)
Anesthesia Evaluation  Patient identified by MRN, date of birth, ID band Patient awake    Reviewed: Allergy & Precautions, H&P , NPO status , Patient's Chart, lab work & pertinent test results  History of Anesthesia Complications Negative for: history of anesthetic complications  Airway Mallampati: II TM Distance: >3 FB Neck ROM: Full    Dental  (+) Teeth Intact and Dental Advisory Given   Pulmonary neg pulmonary ROS,    Pulmonary exam normal       Cardiovascular hypertension,     Neuro/Psych Depression negative neurological ROS     GI/Hepatic Neg liver ROS, GERD-  Medicated,  Endo/Other  Hypothyroidism   Renal/GU negative Renal ROS     Musculoskeletal   Abdominal   Peds  Hematology   Anesthesia Other Findings   Reproductive/Obstetrics                          Anesthesia Physical Anesthesia Plan  ASA: II  Anesthesia Plan: Spinal   Post-op Pain Management: MAC Combined w/ Regional for Post-op pain   Induction:   Airway Management Planned:   Additional Equipment:   Intra-op Plan:   Post-operative Plan:   Informed Consent: I have reviewed the patients History and Physical, chart, labs and discussed the procedure including the risks, benefits and alternatives for the proposed anesthesia with the patient or authorized representative who has indicated his/her understanding and acceptance.   Dental advisory given  Plan Discussed with: CRNA, Anesthesiologist and Surgeon  Anesthesia Plan Comments:        Anesthesia Quick Evaluation

## 2012-09-17 NOTE — Progress Notes (Signed)
Orthopedic Tech Progress Note Patient Details:  Karen Horne [birth year] [id number]  CPM Left Knee CPM Left Knee: On Left Knee Flexion (Degrees): 60 Left Knee Extension (Degrees): 0 Additional Comments: Trapeze bar and foot roll   Shawnie Pons 09/17/2012, 12:49 PM

## 2012-09-17 NOTE — Anesthesia Postprocedure Evaluation (Signed)
Anesthesia Post Note  Patient: Karen Horne  Procedure(s) Performed: Procedure(s) (LRB): TOTAL KNEE ARTHROPLASTY (Left)  Anesthesia type: general  Patient location: PACU  Post pain: Pain level controlled  Post assessment: Patient's Cardiovascular Status Stable  Last Vitals:  Filed Vitals:   09/17/12 1045  BP:   Pulse: 72  Temp: 36.8 C  Resp: 12    Post vital signs: Reviewed and stable  Level of consciousness: sedated  Complications: No apparent anesthesia complications

## 2012-09-17 NOTE — Progress Notes (Signed)
Utilization review complete. Missy Baksh RN CCM Case Mgmt phone [phone] 

## 2012-09-17 NOTE — Evaluation (Signed)
Physical Therapy Evaluation Patient Details Name: Karen Horne MRN: [id number] DOB: 06-10-1950 Today's Date: 09/17/2012 Time: 4098-1191 PT Time Calculation (min): 19 min  PT Assessment / Plan / Recommendation History of Present Illness  s/p left TKA  Clinical Impression  Pt is s/p left TKA POD 0 resulting in the deficits listed below (see PT Problem List). Pt very motivated and ready to work with therapy when entering room.  Pt will benefit from skilled PT to increase their independence and safety with mobility to allow discharge to the venue listed below.     PT Assessment  Patient needs continued PT services    Follow Up Recommendations  Home health PT    Equipment Recommendations  None recommended by PT    Frequency 7X/week    Precautions / Restrictions Precautions Precautions: Knee Required Braces or Orthoses: Knee Immobilizer - Left Restrictions Weight Bearing Restrictions: Yes LLE Weight Bearing: Weight bearing as tolerated   Pertinent Vitals/Pain C/o discomfort but does not rate      Mobility  Bed Mobility Bed Mobility: Supine to Sit Supine to Sit: 4: Min assist;With rails;HOB flat Details for Bed Mobility Assistance: (A) with Left LE OOB Transfers Transfers: Sit to Stand;Stand to Sit Sit to Stand: 4: Min assist;From bed Stand to Sit: 4: Min guard;To chair/3-in-1 Details for Transfer Assistance: (A) to initiate transfer with cues for hand and LE placement Ambulation/Gait Ambulation/Gait Assistance: 4: Min guard Ambulation Distance (Feet): 100 Feet Assistive device: Rolling walker Ambulation/Gait Assistance Details: Minguard for safety with cues for step squence and RW placement Gait Pattern: Step-to pattern;Decreased stride length;Decreased stance time - left;Antalgic Stairs: No    Exercises     PT Diagnosis: Difficulty walking;Acute pain  PT Problem List: Decreased strength;Decreased range of motion;Decreased mobility;Decreased knowledge of use of  DME;Pain PT Treatment Interventions: DME instruction;Gait training;Functional mobility training;Therapeutic activities;Therapeutic exercise;Balance training;Patient/family education     PT Goals(Current goals can be found in the care plan section) Acute Rehab PT Goals Patient Stated Goal: To get back to moving and doing things.  I've been laying up in my bed for way to long because of my knee. PT Goal Formulation: With patient Time For Goal Achievement: 09/24/12 Potential to Achieve Goals: Good  Visit Information  Last PT Received On: 09/17/12 Assistance Needed: +1 History of Present Illness: s/p left TKA       Prior Functioning  Home Living Family/patient expects to be discharged to:: Private residence Living Arrangements: Spouse/significant other Available Help at Discharge: Family Type of Home: Mobile home Home Access: Stairs to enter Entrance Stairs-Number of Steps: 4 Entrance Stairs-Rails: Right;Left;Can reach both Home Layout: One level Home Equipment: Environmental consultant - 2 wheels;Bedside commode Prior Function Level of Independence: Independent Communication Communication: No difficulties Dominant Hand: Right    Cognition  Cognition Arousal/Alertness: Awake/alert Behavior During Therapy: WFL for tasks assessed/performed Overall Cognitive Status: Within Functional Limits for tasks assessed    Extremity/Trunk Assessment Lower Extremity Assessment Lower Extremity Assessment: LLE deficits/detail LLE Deficits / Details: Limited AAROM   Balance    End of Session PT - End of Session Equipment Utilized During Treatment: Gait belt;Left knee immobilizer Activity Tolerance: Patient tolerated treatment well Patient left: in chair;with call bell/phone within reach;with family/visitor present Nurse Communication: Mobility status CPM Left Knee CPM Left Knee: Off  GP     Fintan Grater 09/17/2012, 5:00 PM [place], [place] DPT [phone]

## 2012-09-17 NOTE — Interval H&P Note (Signed)
History and Physical Interval Note:  09/17/2012 7:33 AM  Karen Horne  has presented today for surgery, with the diagnosis of Left knee degenerative joint disease  The various methods of treatment have been discussed with the patient and family. After consideration of risks, benefits and other options for treatment, the patient has consented to  Procedure(s) with comments: TOTAL KNEE ARTHROPLASTY (Left) - Left total knee replacement as a surgical intervention .  The patient's history has been reviewed, patient examined, no change in status, stable for surgery.  I have reviewed the patient's chart and labs.  Questions were answered to the patient's satisfaction.     [name]

## 2012-09-17 NOTE — Anesthesia Procedure Notes (Addendum)
Anesthesia Regional Block:  Femoral nerve block  Pre-Anesthetic Checklist: ,, timeout performed, Correct Patient, Correct Site, Correct Laterality, Correct Procedure, Correct Position, site marked, Risks and benefits discussed,  Surgical consent,  Pre-op evaluation,  At surgeon's request and post-op pain management  Laterality: Left  Prep: chloraprep       Needles:  Injection technique: Single-shot  Needle Type: Echogenic Stimulator Needle     Needle Length: 5cm 5 cm Needle Gauge: 22 and 22 G    Additional Needles:  Procedures: ultrasound guided (picture in chart) and nerve stimulator Femoral nerve block  Nerve Stimulator or Paresthesia:  Response: quadraceps contraction, 0.45 mA,   Additional Responses:   Narrative:  Start time: 09/17/2012 7:04 AM End time: 09/17/2012 7:14 AM Injection made incrementally with aspirations every 5 mL.  Performed by: Personally  Anesthesiologist: Halford Decamp, MD  Additional Notes: Functioning IV was confirmed and monitors were applied.  A 50mm 22ga Arrow echogenic stimulator needle was used. Sterile prep and drape,hand hygiene and sterile gloves were used. Ultrasound guidance: relevant anatomy identified, needle position confirmed, local anesthetic spread visualized around nerve(s)., vascular puncture avoided.  Image printed for medical record. Negative aspiration and negative test dose prior to incremental administration of local anesthetic. The patient tolerated the procedure well.    Femoral nerve block Spinal  Patient location during procedure: OR Start time: 09/17/2012 7:35 AM End time: 09/17/2012 7:44 AM Staffing Anesthesiologist: Remonia Richter Preanesthetic Checklist Completed: patient identified, site marked, surgical consent, pre-op evaluation, timeout performed, IV checked, risks and benefits discussed and monitors and equipment checked Spinal Block Patient position: sitting Prep: Betasept Patient monitoring:  heart rate, cardiac monitor, continuous pulse ox and blood pressure Approach: midline Location: L2-3 Injection technique: single-shot Needle Needle type: Quincke  Needle gauge: 25 G Needle length: 9 cm Needle insertion depth: 6 cm

## 2012-09-17 NOTE — Transfer of Care (Signed)
Immediate Anesthesia Transfer of Care Note  Patient: Karen Horne  Procedure(s) Performed: Procedure(s) with comments: TOTAL KNEE ARTHROPLASTY (Left) - Left total knee replacement  Patient Location: PACU  Anesthesia Type:General  Level of Consciousness: awake, alert , oriented and patient cooperative  Airway & Oxygen Therapy: Patient Spontanous Breathing  Post-op Assessment: Report given to PACU RN, Post -op Vital signs reviewed and stable and Patient moving all extremities  Post vital signs: Reviewed and stable  Complications: No apparent anesthesia complications

## 2012-09-17 NOTE — Op Note (Signed)
PREOP DIAGNOSIS: DJD LEFT KNEE POSTOP DIAGNOSIS:  same PROCEDURE: LEFT TKR ANESTHESIA: General and block and failed spinal ATTENDING SURGEON: Tambi Thole G ASSISTANT: Lindwood Qua PA and April Green RNFA  INDICATIONS FOR PROCEDURE: Karen Horne is a 62 y.o. female who has struggled for a long time with pain due to degenerative arthritis of the left knee.  The patient has failed many conservative non-operative measures and at this point has pain which limits the ability to sleep and walk.  The patient is offered total knee replacement.  Informed operative consent was obtained after discussion of possible risks of anesthesia, infection, neurovascular injury, DVT, and death.  The importance of the post-operative rehabilitation protocol to optimize result was stressed extensively with the patient.  SUMMARY OF FINDINGS AND PROCEDURE:  Karen Horne was taken to the operative suite where under the above anesthesia a left knee replacement was performed.  There were advanced degenerative changes and the bone quality was good.  She had hemosiderin stained synovium.  We used the DePuy system and placed size standard plus femur, 4 tibia, 38 mm all polyethylene patella, and a size 10 mm spacer.  The patient was admitted for appropriate post-op care to include perioperative antibiotics and mechanical and pharmacologic measures for DVT prophylaxis.  We did dose with tranexamic acid during the case.  DESCRIPTION OF PROCEDURE:  Karen Horne was taken to the operative suite where the above anesthesia was applied.  The patient was positioned supine and prepped and draped in normal sterile fashion.  An appropriate time out was performed.  After the administration of kefzol pre-op antibiotic the leg was elevated and exsanguinated and a tourniquet inflated.  A standard longitudinal incision was made on the anterior knee.  Dissection was carried down to the extensor mechanism.  All appropriate anti-infective measures  were used including the pre-operative antibiotic, betadine impregnated drape, and closed hooded exhaust systems for each member of the surgical team.  A medial parapatellar incision was made in the extensor mechanism and the knee cap flipped and the knee flexed.  Some residual meniscal tissues were removed along with any remaining ACL/PCL tissue.  A guide was placed on the tibia and a flat cut was made on it's superior surface.  An intramedullary guide was placed in the femur and was utilized to make anterior and posterior cuts creating an appropriate flexion gap.  A second intramedullary guide was placed in the femur to make a distal cut properly balancing the knee with an extension gap equal to the flexion gap.  The three bones sized to the above mentioned sizes and the appropriate guides were placed and utilized.  A trial reduction was done and the knee easily came to full extension and the patella tracked well on flexion.  The trial components were removed and all bones were cleaned with pulsatile lavage and then dried thoroughly.  Cement was mixed and was pressurized onto the bones followed by placement of the aforementioned components.  Excess cement was trimmed and pressure was held on the components until the cement had hardened.  The tourniquet was deflated and a small amount of bleeding was controlled with cautery and pressure.  The knee was irrigated thoroughly.  The extensor mechanism was re-approximated with V-loc suture in running fashion.  The knee was flexed and the repair was solid.  The subcutaneous tissues were re-approximated with #0 and #2-0 vicryl and the skin closed with a subcuticular stitch and steristrips.  A sterile dressing was applied.  Intraoperative fluids, EBL, and tourniquet time can be obtained from anesthesia records.  DISPOSITION:  The patient was taken to recovery room in stable condition and admitted for appropriate post-op care to include peri-operative antibiotic and DVT  prophylaxis with mechanical and pharmacologic measures.  Karen Horne G 09/17/2012, 9:24 AM

## 2012-09-18 ENCOUNTER — Encounter (HOSPITAL_COMMUNITY): Payer: Self-pay | Admitting: Orthopaedic Surgery

## 2012-09-18 LAB — BASIC METABOLIC PANEL
BUN: 15 mg/dL (ref 6–23)
Calcium: 9.2 mg/dL (ref 8.4–10.5)
GFR calc non Af Amer: 75 mL/min — ABNORMAL LOW (ref >90)
Glucose, Bld: 107 mg/dL — ABNORMAL HIGH (ref 70–99)

## 2012-09-18 LAB — CBC
HCT: 33.6 % — ABNORMAL LOW (ref 36.0–46.0)
Hemoglobin: 11.3 g/dL — ABNORMAL LOW (ref 12.0–15.0)
MCH: 30.5 pg (ref 26.0–34.0)
MCHC: 33.6 g/dL (ref 30.0–36.0)
RDW: 13.2 % (ref 11.5–15.5)

## 2012-09-18 NOTE — Evaluation (Signed)
Occupational Therapy Evaluation Patient Details Name: Karen Horne MRN: [id number] DOB: May 20, 1950 Today's Date: 09/18/2012 Time: 4098-1191 OT Time Calculation (min): 24 min  OT Assessment / Plan / Recommendation History of present illness s/p left TKA   Clinical Impression   Pt demos decline in function with ADLs and ADL mobility following L TKA. Pt would benefit from acute OT services to address impairments to help restore PLOF to return home safely    OT Assessment  Patient needs continued OT Services    Follow Up Recommendations  Home health OT;Supervision/Assistance - 24 hour    Barriers to Discharge   none  Equipment Recommendations   none by OT    Recommendations for Other Services    Frequency  Min 2X/week    Precautions / Restrictions Precautions Precautions: Knee Required Braces or Orthoses: Knee Immobilizer - Left Restrictions Weight Bearing Restrictions: Yes LLE Weight Bearing: Weight bearing as tolerated   Pertinent Vitals/Pain 6/10    ADL  Grooming: Performed;Wash/dry hands;Wash/dry face Upper Body Bathing: Simulated;Supervision/safety;Set up Lower Body Bathing: Simulated;Maximal assistance Upper Body Dressing: Performed;Supervision/safety;Set up Lower Body Dressing: +1 Total assistance Transfers/Ambulation Related to ADLs: pt declined ADL Comments: pt declined OOB activity and transfers to [facility] due to fainitng this morning ater toileting with nurse tech and her husband. Pt and her husband provided with education ans demo of ADL A/E, DME for bathroom safety education    OT Diagnosis: Generalized weakness;Acute pain  OT Problem List: Decreased activity tolerance;Pain;Decreased knowledge of use of DME or AE OT Treatment Interventions: Self-care/ADL training;Therapeutic exercise;Patient/family education;Neuromuscular education;Balance training;Therapeutic activities;DME and/or AE instruction   OT Goals(Current goals can be found in the care plan  section) Acute Rehab OT Goals Patient Stated Goal: to go home soon OT Goal Formulation: With patient/family Time For Goal Achievement: 09/25/12 Potential to Achieve Goals: Good ADL Goals Pt Will Perform Grooming: standing;with min assist;with caregiver independent in assisting Pt Will Perform Lower Body Bathing: with mod assist;with min assist;with adaptive equipment;with caregiver independent in assisting Pt Will Perform Lower Body Dressing: with mod assist;with max assist;with adaptive equipment;with caregiver independent in assisting Pt Will Transfer to Toilet: with min assist;bedside commode Pt Will Perform Toileting - Clothing Manipulation and hygiene: with min assist;with caregiver independent in assisting Pt Will Perform Tub/Shower Transfer: with min assist;tub bench;with caregiver independent in assisting  Visit Information  Last OT Received On: 09/18/12 Assistance Needed: +1 History of Present Illness: s/p left TKA       Prior Functioning     Home Living Family/patient expects to be discharged to:: Private residence Living Arrangements: Spouse/significant other Available Help at Discharge: Family Type of Home: Mobile home Home Access: Stairs to enter Entrance Stairs-Rails: Right;Left;Can reach both Home Layout: One level Home Equipment: Environmental consultant - 2 wheels;Bedside commode Prior Function Level of Independence: Independent Communication Communication: No difficulties Dominant Hand: Right         Vision/Perception Vision - History Baseline Vision: Wears glasses only for reading Patient Visual Report: No change from baseline Perception Perception: Within Functional Limits   Cognition  Cognition Arousal/Alertness: Awake/alert Behavior During Therapy: WFL for tasks assessed/performed Overall Cognitive Status: Within Functional Limits for tasks assessed    Extremity/Trunk Assessment Upper Extremity Assessment Upper Extremity Assessment: Overall WFL for tasks  assessed     Mobility Transfers Transfers: Not assessed Details for Transfer Assistance: pt declined     Exercise     Balance Balance Balance Assessed: Yes Dynamic Sitting Balance Dynamic Sitting - Balance Support: No upper extremity  supported;During functional activity Dynamic Sitting - Level of Assistance: 5: Stand by assistance   End of Session OT - End of Session Activity Tolerance: Patient limited by fatigue;Patient limited by lethargy;Patient limited by pain Patient left: in bed;with call bell/phone within reach;with family/visitor present CPM Left Knee CPM Left Knee: Off  GO     Margaretmary Eddy [facility] 09/18/2012, 1:36 PM

## 2012-09-18 NOTE — Progress Notes (Signed)
Physical Therapy Treatment Patient Details Name: Karen Horne MRN: [id number] DOB: 1950-06-16 Today's Date: 09/18/2012 Time: 0755-0820 PT Time Calculation (min): 25 min  PT Assessment / Plan / Recommendation  History of Present Illness s/p left TKA   PT Comments   Pt making great progress with therapy. Was able to increase amb distance and amb steps with min guar (A) for cues and safey. Vc's for gt sequencing and postural education with gt. Will cont to f/u with pt to maximize functional mobility.   Follow Up Recommendations  Home health PT     Does the patient have the potential to tolerate intense rehabilitation     Barriers to Discharge        Equipment Recommendations  None recommended by PT    Recommendations for Other Services    Frequency 7X/week   Progress towards PT Goals Progress towards PT goals: Progressing toward goals  Plan Current plan remains appropriate    Precautions / Restrictions Precautions Precautions: Knee Required Braces or Orthoses: Knee Immobilizer - Left Knee Immobilizer - Left: Other (comment) (pt c/o pain with KI; can perform LAQ) Restrictions Weight Bearing Restrictions: Yes LLE Weight Bearing: Weight bearing as tolerated   Pertinent Vitals/Pain 4/10; premedicated     Mobility  Bed Mobility Bed Mobility: Supine to Sit Supine to Sit: HOB flat;5: Supervision Details for Bed Mobility Assistance: supervision for min cues to use long sheet and UEs to independently advance Lt LE to EOB; pt able to long sit then use UEs to bring Lt LE off EOB; discontinued use of handrails to simulate home enviroment  Transfers Transfers: Sit to Stand;Stand to Sit Sit to Stand: 5: Supervision;From bed Stand to Sit: 5: Supervision;To chair/3-in-1 Details for Transfer Assistance: cues for hand placement and sequencing; supervision for safety  Ambulation/Gait Ambulation/Gait Assistance: 5: Supervision Ambulation Distance (Feet): 400 Feet Assistive device:  Rolling walker Ambulation/Gait Assistance Details: supervision for safety; cues for gt sequencing to equalize WB and step length; cues for safety with RW management  Gait Pattern: Step-to pattern;Decreased stride length;Decreased stance time - left;Antalgic;Step-through pattern Gait velocity: decreased; beginning to increase  Stairs: Yes Stairs Assistance: 4: Min guard;5: Supervision Stairs Assistance Details (indicate cue type and reason): cues for gt sequencing and hand placement; min guard for safety  Stair Management Technique: Two rails;Step to pattern;Forwards Number of Stairs: 2 Wheelchair Mobility Wheelchair Mobility: No    Exercises Total Joint Exercises Ankle Circles/Pumps: AROM;10 reps;Supine Quad Sets: AROM;Left;10 reps;Seated Heel Slides: AAROM;Left;10 reps;Seated Hip ABduction/ADduction: AROM;Left;10 reps;Seated Long Arc Quad: AROM;Left;10 reps;Seated Goniometric ROM: AROM knee flexion to 80 degrees   PT Diagnosis:    PT Problem List:   PT Treatment Interventions:     PT Goals (current goals can now be found in the care plan section) Acute Rehab PT Goals Patient Stated Goal: to go home soon PT Goal Formulation: With patient Time For Goal Achievement: 09/24/12 Potential to Achieve Goals: Good  Visit Information  Last PT Received On: 09/18/12 Assistance Needed: +1 History of Present Illness: s/p left TKA    Subjective Data  Subjective: Pt lying supine. "there is just no sleep in hospital.  i had a good night though."  Patient Stated Goal: to go home soon   Cognition  Cognition Arousal/Alertness: Awake/alert Behavior During Therapy: WFL for tasks assessed/performed Overall Cognitive Status: Within Functional Limits for tasks assessed    Balance  Balance Balance Assessed: No  End of Session PT - End of Session Equipment Utilized During Treatment: Gait belt  Activity Tolerance: Patient tolerated treatment well Patient left: in chair;with call bell/phone  within reach;with family/visitor present Nurse Communication: Mobility status   GP     Donell Sievert, [place] [phone] 09/18/2012, 8:35 AM

## 2012-09-18 NOTE — Plan of Care (Signed)
Problem: Phase II Progression Outcomes Goal: Discharge plan established Depending on acute care progress, recommend HH OT for ADLs and ADL mobility safety trg for d/c home

## 2012-09-18 NOTE — Progress Notes (Signed)
Subjective: 1 Day Post-Op Procedure(s) (LRB): TOTAL KNEE ARTHROPLASTY (Left)  Activity level:  Has been walking with therapy weightbearing as tolerated. Diet tolerance:  Eating Voiding:  Foley catheter out Patient reports pain as 2 on 0-10 scale.    Objective: Vital signs in last 24 hours: Temp:  [97.7 F (36.5 C)-99.2 F (37.3 C)] 99 F (37.2 C) (08/20 0517) Pulse Rate:  [67-90] 90 (08/20 0517) Resp:  [12-23] 16 (08/20 0517) BP: (103-147)/(49-77) 119/77 mmHg (08/20 0517) SpO2:  [96 %-100 %] 99 % (08/20 0517)  Labs:  Recent Labs  09/18/12 0550  HGB 11.3*    Recent Labs  09/18/12 0550  WBC 10.0  RBC 3.70*  HCT 33.6*  PLT 161    Recent Labs  09/18/12 0550  NA 139  K 4.6  CL 102  CO2 29  BUN 15  CREATININE 0.82  GLUCOSE 107*  CALCIUM 9.2   No results found for this basename: LABPT, INR,  in the last 72 hours  Physical Exam:  Neurologically intact ABD soft Neurovascular intact Sensation intact distally Intact pulses distally Dorsiflexion/Plantar flexion intact Incision: dressing C/D/I No cellulitis present Compartment soft  Assessment/Plan:  1 Day Post-Op Procedure(s) (LRB): TOTAL KNEE ARTHROPLASTY (Left) Advance diet Up with therapy Plan for discharge tomorrow with home health therapy.  ASA 325 twice a day x2 weeks/SCDs Plan discharge tomorrow in care of husband if okay with therapy. Dressing change today.    Echo [place]sbrook R 09/18/2012, 8:28 AM

## 2012-09-18 NOTE — Progress Notes (Signed)
PT is recommending home with HH and not SNF. CSW will make CM aware. Clinical Social Worker will sign off for now as social work intervention is no longer needed. Please consult us again if new need arises.   Delos Klich, MSW [phone] 

## 2012-09-18 NOTE — Progress Notes (Addendum)
Pt assisted to bathroom while sitting on toliet became weak pale  Diaphoretic  Assisted to floor.Lifted back to bed 97.5 88 16 132/62 PA  Notified Shona Simpson no furthur orders

## 2012-09-18 NOTE — Progress Notes (Signed)
Physical Therapy Treatment Patient Details Name: Karen Horne MRN: [id number] DOB: Nov 18, 1950 Today's Date: 09/18/2012 Time: 4098-1191 PT Time Calculation (min): 19 min  PT Assessment / Plan / Recommendation  History of Present Illness s/p left TKA   PT Comments   Pt limited this session due to decreased BP with activity. Checked orthostatics; BP in supine 107/62; in sitting 86/49 and pt became symptomatic and returned to supine. Pt was able achieve standing initially during session asymptomatic but quickly became nauseous with standing and refused to stand for orthostatics. Pt agreeable to increase theraex this afternoon. Pt staying positive about PT progress thus far and given max encouragement that this is a minor set back. Continue to anticipate D/C home when medically stable. Pt encouraged to have staff with her when transferring or amb due to fall risk. Pt agreeable.   Follow Up Recommendations  Home health PT     Does the patient have the potential to tolerate intense rehabilitation     Barriers to Discharge        Equipment Recommendations  None recommended by PT    Recommendations for Other Services    Frequency 7X/week   Progress towards PT Goals Progress towards PT goals: Not progressing toward goals - comment (limited this session due to decr BP)  Plan Current plan remains appropriate    Precautions / Restrictions Precautions Precautions: Knee Required Braces or Orthoses: Knee Immobilizer - Left Restrictions Weight Bearing Restrictions: Yes LLE Weight Bearing: Weight bearing as tolerated   Pertinent Vitals/Pain See assessment for BP; pt c/o pain in front of knee after recent fall; 8/10     Mobility  Bed Mobility Bed Mobility: Supine to Sit;Sit to Supine Supine to Sit: HOB flat;5: Supervision Sit to Supine: 4: Min assist;HOB flat;With rail Details for Bed Mobility Assistance: pt able to perform supine to long sit and self (A) Lt LE off bed; min (A) to return to  supine due to nausea and dizziness Transfers Transfers: Sit to Stand;Stand to Sit Sit to Stand: 4: Min guard;From bed;From elevated surface;With upper extremity assist Stand to Sit: 4: Min guard;To bed;With upper extremity assist Details for Transfer Assistance: during sit to stand transfer; pt became nauseous and dizzy once again and was returned to supine immediately to have vitals checked; pt given cues for hand placement and safety; min guard for safety  Ambulation/Gait Ambulation/Gait Assistance: Not tested (comment) Wheelchair Mobility Wheelchair Mobility: No    Exercises Total Joint Exercises Ankle Circles/Pumps: AROM;10 reps;Supine Quad Sets: AROM;Left;10 reps;Supine Short Arc Quad: Left;AROM;10 reps;Supine Heel Slides: AAROM;Left;Supine;Both (limited by pain) Straight Leg Raises: AROM;10 reps;Left;Supine   PT Diagnosis:    PT Problem List:   PT Treatment Interventions:     PT Goals (current goals can now be found in the care plan section) Acute Rehab PT Goals Patient Stated Goal: to not be sick and fall again PT Goal Formulation: With patient Time For Goal Achievement: 09/24/12 Potential to Achieve Goals: Good  Visit Information  Last PT Received On: 09/18/12 Assistance Needed: +1 History of Present Illness: s/p left TKA    Subjective Data  Subjective: pt reports "i still feel weak and sick from falling. I hope i dont ever feel like that again" Patient Stated Goal: to not be sick and fall again   Cognition  Cognition Arousal/Alertness: Awake/alert Behavior During Therapy: WFL for tasks assessed/performed Overall Cognitive Status: Within Functional Limits for tasks assessed    Balance  Balance Balance Assessed: Yes Static Sitting Balance Static  Sitting - Balance Support: Bilateral upper extremity supported;Feet supported Static Sitting - Level of Assistance: 5: Stand by assistance Dynamic Sitting Balance Dynamic Sitting - Balance Support: No upper extremity  supported;During functional activity Dynamic Sitting - Level of Assistance: 5: Stand by assistance  End of Session PT - End of Session Equipment Utilized During Treatment: Gait belt Activity Tolerance: Patient limited by pain;Other (comment) (BP changes) Patient left: in bed;with call bell/phone within reach;with family/visitor present Nurse Communication: Mobility status;Other (comment) (BP changes) CPM Left Knee CPM Left Knee: [address]   GP     Donnamarie Poag [place], [place] [phone] 09/18/2012, 3:43 PM

## 2012-09-19 LAB — CBC
MCH: 31.2 pg (ref 26.0–34.0)
MCHC: 34.4 g/dL (ref 30.0–36.0)
Platelets: 149 10*3/uL — ABNORMAL LOW (ref 150–400)
RDW: 13.7 % (ref 11.5–15.5)

## 2012-09-19 MED ORDER — TRAMADOL HCL 50 MG PO TABS: 50.0000 mg | ORAL_TABLET | Freq: Four times a day (QID) | ORAL | Status: DC | PRN

## 2012-09-19 MED ORDER — ASPIRIN 325 MG PO TBEC: 325.0000 mg | DELAYED_RELEASE_TABLET | Freq: Two times a day (BID) | ORAL | Status: DC

## 2012-09-19 NOTE — Progress Notes (Signed)
09/19/12 Patient set up with [facility] for HHPT by MD office. Spoke with patient, no change in discharge plan.[employer] has alreday delivered CPM, 3N1 and rolling walker to her home. Patient's husband will be available to assist after discharge. Jacquelynn Cree RN, BSN, CCM

## 2012-09-19 NOTE — Progress Notes (Signed)
Physical Therapy Treatment Patient Details Name: Karen Horne MRN: [id number] DOB: Nov 04, 1950 Today's Date: 09/19/2012 Time: 0865-7846 PT Time Calculation (min): 24 min  PT Assessment / Plan / Recommendation  History of Present Illness s/p left TKA   PT Comments   Pt progressing well today with therapy. Pt able to increase amb and theraex. Pt is safe from mobility standpoint to D/C home with husband. Pt is supervision for mobility and transfers; min cues for safety.   Follow Up Recommendations  Home health PT     Does the patient have the potential to tolerate intense rehabilitation     Barriers to Discharge        Equipment Recommendations  None recommended by PT    Recommendations for Other Services    Frequency 7X/week   Progress towards PT Goals Progress towards PT goals: Progressing toward goals  Plan Current plan remains appropriate    Precautions / Restrictions Precautions Precautions: Knee Restrictions Weight Bearing Restrictions: Yes LLE Weight Bearing: Weight bearing as tolerated   Pertinent Vitals/Pain 6/10; pt premedicated     Mobility  Bed Mobility Bed Mobility: Supine to Sit Supine to Sit: HOB flat;5: Supervision Details for Bed Mobility Assistance: supervision for safety and min cues for technique  Transfers Transfers: Sit to Stand;Stand to Sit Sit to Stand: 5: Supervision;From bed;From chair/3-in-1;With armrests Stand to Sit: 5: Supervision;To chair/3-in-1 Details for Transfer Assistance: pt transferred from bed to [facility] to recliner with no phyiscal (A) but with min cues for hand placement and safety  Ambulation/Gait Ambulation/Gait Assistance: 5: Supervision Ambulation Distance (Feet): 400 Feet Assistive device: Rolling walker Ambulation/Gait Assistance Details: cues for gt sequencing and upright posture; pt has tendency to pick up RW, encouragd pt to leave RW on ground for safety  Gait Pattern: Step-to pattern;Decreased stride length;Decreased  stance time - left;Antalgic;Step-through pattern Gait velocity: pt progressing to steady gt with step through pattern Stairs: Yes Stairs Assistance: 5: Supervision Stairs Assistance Details (indicate cue type and reason): min cues for sequencing and safety  Stair Management Technique: Two rails;Step to pattern;Forwards Number of Stairs: 2 Wheelchair Mobility Wheelchair Mobility: No    Exercises Total Joint Exercises Ankle Circles/Pumps: AROM;10 reps;Supine Quad Sets: 10 reps;Left;Seated Heel Slides: AAROM;Left;10 reps;Supine Hip ABduction/ADduction: AROM;Left;10 reps;Seated Goniometric ROM: AROM knee flexion to 70 degrees; limited by pain   PT Diagnosis:    PT Problem List:   PT Treatment Interventions:     PT Goals (current goals can now be found in the care plan section) Acute Rehab PT Goals Patient Stated Goal: to move like i was PT Goal Formulation: With patient Time For Goal Achievement: 09/24/12 Potential to Achieve Goals: Good  Visit Information  Last PT Received On: 09/19/12 Assistance Needed: +1 History of Present Illness: s/p left TKA    Subjective Data  Subjective: " i have been up some. just to the bedside. i need to go to the bathroom but im scared"  Patient Stated Goal: to move like i was   Cognition  Cognition Arousal/Alertness: Awake/alert Behavior During Therapy: WFL for tasks assessed/performed Overall Cognitive Status: Within Functional Limits for tasks assessed    Balance  Balance Balance Assessed: No  End of Session PT - End of Session Equipment Utilized During Treatment: Gait belt Activity Tolerance: Patient tolerated treatment well Patient left: in chair;with call bell/phone within reach;with family/visitor present Nurse Communication: Mobility status   GP     Donell Sievert, [place] [phone] 09/19/2012, 8:29 AM

## 2012-09-19 NOTE — Progress Notes (Signed)
Occupational Therapy Treatment Patient Details Name: Karen Horne MRN: [id number] DOB: [birth year] Today's Date: 09/19/2012 Time: 4098-1191 OT Time Calculation (min): 8 min  OT Assessment / Plan / Recommendation  History of present illness s/p left TKA   OT comments  All education completed. Acute OT will sign off.  Follow Up Recommendations  Home health OT;Supervision/Assistance - 24 hour       Equipment Recommendations   (pt to get tub bench on her own)       Frequency Min 2X/week   Progress towards OT Goals Progress towards OT goals: Progressing toward goals  Plan Discharge plan remains appropriate    Precautions / Restrictions Precautions Precautions: Knee Restrictions Weight Bearing Restrictions: No LLE Weight Bearing: Weight bearing as tolerated       ADL  ADL Comments: Pt reports that she dressed herself today putting her LLE in first. Went over that pt/husband will get a tub bench on their on for her tub/shower combination. Went over with them using "Press-n-seal" over the incisional bandage as extra precaution for keeping area dry. No further questions from pt or husband.      OT Goals(current goals can now be found in the care plan section)    Visit Information  Last OT Received On: 09/19/12 Assistance Needed: +1 History of Present Illness: s/p left TKA          Cognition  Cognition Arousal/Alertness: Awake/alert Behavior During Therapy: WFL for tasks assessed/performed Overall Cognitive Status: Within Functional Limits for tasks assessed             End of Session OT - End of Session Patient left: in chair;with call bell/phone within reach;with nursing/sitter in room;with family/visitor present       Evette Georges [phone] 09/19/2012, 1:26 PM

## 2012-09-19 NOTE — Progress Notes (Signed)
Subjective: 2 Days Post-Op Procedure(s) (LRB): TOTAL KNEE ARTHROPLASTY (Left)  Activity level:  Patient has done well with therapy. Some issues with low blood pressure but overall doing well. Blood pressure medicine was held today. Diet tolerance:  Eating well Voiding:  Voiding Patient reports pain as 2 on 0-10 scale.    Objective: Vital signs in last 24 hours: Temp:  [98.6 F (37 C)-99 F (37.2 C)] 98.6 F (37 C) (08/21 0634) Pulse Rate:  [79-96] 95 (08/21 0850) Resp:  [18] 18 (08/21 0634) BP: (94-141)/(49-82) 94/49 mmHg (08/21 0850) SpO2:  [98 %] 98 % (08/21 0634)  Labs:  Recent Labs  09/18/12 0550 09/19/12 0500  HGB 11.3* 10.1*    Recent Labs  09/18/12 0550 09/19/12 0500  WBC 10.0 8.8  RBC 3.70* 3.24*  HCT 33.6* 29.4*  PLT 161 149*    Recent Labs  09/18/12 0550  NA 139  K 4.6  CL 102  CO2 29  BUN 15  CREATININE 0.82  GLUCOSE 107*  CALCIUM 9.2   No results found for this basename: LABPT, INR,  in the last 72 hours  Physical Exam:  Neurologically intact ABD soft Neurovascular intact Sensation intact distally Intact pulses distally Dorsiflexion/Plantar flexion intact Incision: dressing C/D/I No cellulitis present Compartment soft dressing changed today.  Assessment/Plan:  2 Days Post-Op Procedure(s) (LRB): TOTAL KNEE ARTHROPLASTY (Left) Advance diet Up with therapy Discharge home with home health Patient will hold her blood pressure medicine for a day or 2 and monitor at home with home pressure cuff. She will restart her medicine when appropriate as we've discussed today. Continue ASA 325 one twice a day x2 weeks. Current office in 2 weeks. Home therapy We'll discharge home on Ultram for pain control.   Karen Horne R 09/19/2012, 12:05 PM

## 2012-09-19 NOTE — Discharge Summary (Signed)
Patient ID: CUMA POLYAKOV MRN: [id number] DOB/AGE: [birth year] 62 y.o.  Admit date: 09/17/2012 Discharge date: 09/19/2012  Admission Diagnoses:  Principal Problem:   Left knee DJD   Discharge Diagnoses:  Same  Past Medical History  Diagnosis Date  . Hypothyroidism   . Hypertension   . Chronic kidney disease pyelonephritis  . Hypercholesterolemia   . IBS (irritable bowel syndrome)   . Hemorrhoids   . Fatty liver   . Depression   . GERD (gastroesophageal reflux disease)     surgery for acid reflux 10 yrs ago  . Arthritis     Surgeries: Procedure(s): TOTAL KNEE ARTHROPLASTY on 09/17/2012   Consultants:    Discharged Condition: Improved  Hospital Course: TYNASIA MCCAUL is an 62 y.o. female who was admitted 09/17/2012 for operative treatment ofLeft knee DJD. Patient has severe unremitting pain that affects sleep, daily activities, and work/hobbies. After pre-op clearance the patient was taken to the operating room on 09/17/2012 and underwent  Procedure(s): TOTAL KNEE ARTHROPLASTY.    Patient was given perioperative antibiotics: Anti-infectives   Start     Dose/Rate Route Frequency Ordered Stop   09/17/12 1400  ceFAZolin (ANCEF) IVPB 2 g/50 mL premix     2 g 100 mL/hr over 30 Minutes Intravenous Every 6 hours 09/17/12 1158 09/17/12 2349   09/17/12 0600  ceFAZolin (ANCEF) IVPB 2 g/50 mL premix     2 g 100 mL/hr over 30 Minutes Intravenous On call to O.R. 09/16/12 1420 09/17/12 0750       Patient was given sequential compression devices, early ambulation, and chemoprophylaxis to prevent DVT.  Patient benefited maximally from hospital stay and there were no complications.    Recent vital signs: Patient Vitals for the past 24 hrs:  BP Temp Temp src Pulse Resp SpO2  09/19/12 0850 94/49 mmHg - - 95 - -  09/19/12 0634 141/82 mmHg 98.6 F (37 C) Oral 96 18 98 %  09/18/12 2120 110/56 mmHg 98.6 F (37 C) Oral 88 18 98 %  09/18/12 1354 107/58 mmHg 99 F (37.2 C) - 79 18 98 %      Recent laboratory studies:  Recent Labs  09/18/12 0550 09/19/12 0500  WBC 10.0 8.8  HGB 11.3* 10.1*  HCT 33.6* 29.4*  PLT 161 149*  NA 139  --   K 4.6  --   CL 102  --   CO2 29  --   BUN 15  --   CREATININE 0.82  --   GLUCOSE 107*  --   CALCIUM 9.2  --      Discharge Medications:     Medication List         aspirin 325 MG EC tablet  Take 1 tablet (325 mg total) by mouth 2 (two) times daily.     atorvastatin 40 MG tablet  Commonly known as:  LIPITOR  Take 40 mg by mouth daily.     liothyronine 25 MCG tablet  Commonly known as:  CYTOMEL  Take 75 mcg by mouth daily.     lisinopril-hydrochlorothiazide 20-12.5 MG per tablet  Commonly known as:  PRINZIDE,ZESTORETIC  Take 1 tablet by mouth daily.     traMADol 50 MG tablet  Commonly known as:  ULTRAM  Take 1 tablet (50 mg total) by mouth every 6 (six) hours as needed for pain.     venlafaxine XR 37.5 MG 24 hr capsule  Commonly known as:  EFFEXOR-XR  Take 37.5 mg by mouth daily.  Vitamin B-12 5000 MCG Subl  Place 5,000 mcg under the tongue daily.        Diagnostic Studies: Dg Chest 2 View  09/10/2012   *RADIOLOGY REPORT*  Clinical Data: Left knee osteoarthritis.  Preop respiratory exam for knee joint replacement.  Hypertension.  Gastroesophageal reflux disease.  CHEST - 2 VIEW  Comparison:  05/20/2010  Findings:  The heart size and mediastinal contours are within normal limits.  Both lungs are clear.  The visualized skeletal structures are unremarkable.  Surgical clips noted in the epigastric region.  IMPRESSION: No active cardiopulmonary disease.   Original Report Authenticated By: Myles Rosenthal, M.D.    Disposition: 01-Home or Self Care      Discharge Orders   Future Orders Complete By Expires   Call MD / Call 911  As directed    Comments:     If you experience chest pain or shortness of breath, CALL 911 and be transported to the hospital emergency room.  If you develope a fever above 101 F, pus  (white drainage) or increased drainage or redness at the wound, or calf pain, call your surgeon's office.   Constipation Prevention  As directed    Comments:     Drink plenty of fluids.  Prune juice may be helpful.  You may use a stool softener, such as Colace (over the counter) 100 mg twice a day.  Use MiraLax (over the counter) for constipation as needed.   Diet - low sodium heart healthy  As directed    Increase activity slowly as tolerated  As directed       Follow-up Information   Follow up with DALLDORF,PETER G, MD. Call in 2 weeks.   Specialty:  Orthopedic Surgery   Contact information:   [address] [place] [address] [phone]        Signed: Prince Rome 09/19/2012, 12:11 PM

## 2015-01-04 ENCOUNTER — Encounter: Payer: Self-pay | Admitting: Gastroenterology

## 2015-12-29 ENCOUNTER — Encounter: Payer: Self-pay | Admitting: *Deleted

## 2016-07-31 ENCOUNTER — Other Ambulatory Visit: Payer: Self-pay | Admitting: Orthopaedic Surgery

## 2016-07-31 DIAGNOSIS — M25512 Pain in left shoulder: Secondary | ICD-10-CM

## 2016-08-19 ENCOUNTER — Ambulatory Visit
Admission: RE | Admit: 2016-08-19 | Discharge: 2016-08-19 | Disposition: A | Discharge status: Home / Self Care | Payer: Self-pay | Source: Ambulatory Visit | Attending: Orthopaedic Surgery | Admitting: Orthopaedic Surgery

## 2016-08-19 DIAGNOSIS — M25512 Pain in left shoulder: Secondary | ICD-10-CM

## 2017-02-25 ENCOUNTER — Emergency Department (HOSPITAL_COMMUNITY): Payer: 59

## 2017-02-25 ENCOUNTER — Observation Stay (HOSPITAL_COMMUNITY)
Admission: EM | Admit: 2017-02-25 | Discharge: 2017-02-26 | Disposition: A | Discharge status: Home / Self Care | Payer: 59 | Attending: Internal Medicine | Admitting: Internal Medicine

## 2017-02-25 ENCOUNTER — Encounter (HOSPITAL_COMMUNITY): Payer: Self-pay

## 2017-02-25 ENCOUNTER — Encounter: Payer: Self-pay | Admitting: Internal Medicine

## 2017-02-25 DIAGNOSIS — E860 Dehydration: Secondary | ICD-10-CM | POA: Diagnosis not present

## 2017-02-25 DIAGNOSIS — N179 Acute kidney failure, unspecified: Secondary | ICD-10-CM | POA: Diagnosis not present

## 2017-02-25 DIAGNOSIS — N189 Chronic kidney disease, unspecified: Secondary | ICD-10-CM | POA: Diagnosis not present

## 2017-02-25 DIAGNOSIS — M1990 Unspecified osteoarthritis, unspecified site: Secondary | ICD-10-CM | POA: Diagnosis not present

## 2017-02-25 DIAGNOSIS — I129 Hypertensive chronic kidney disease with stage 1 through stage 4 chronic kidney disease, or unspecified chronic kidney disease: Secondary | ICD-10-CM | POA: Insufficient documentation

## 2017-02-25 DIAGNOSIS — E785 Hyperlipidemia, unspecified: Secondary | ICD-10-CM | POA: Insufficient documentation

## 2017-02-25 DIAGNOSIS — F329 Major depressive disorder, single episode, unspecified: Secondary | ICD-10-CM | POA: Insufficient documentation

## 2017-02-25 DIAGNOSIS — E039 Hypothyroidism, unspecified: Secondary | ICD-10-CM | POA: Insufficient documentation

## 2017-02-25 DIAGNOSIS — K589 Irritable bowel syndrome without diarrhea: Secondary | ICD-10-CM | POA: Diagnosis not present

## 2017-02-25 DIAGNOSIS — K760 Fatty (change of) liver, not elsewhere classified: Secondary | ICD-10-CM | POA: Insufficient documentation

## 2017-02-25 DIAGNOSIS — K219 Gastro-esophageal reflux disease without esophagitis: Secondary | ICD-10-CM | POA: Diagnosis not present

## 2017-02-25 DIAGNOSIS — Z79899 Other long term (current) drug therapy: Secondary | ICD-10-CM | POA: Insufficient documentation

## 2017-02-25 DIAGNOSIS — R55 Syncope and collapse: Principal | ICD-10-CM | POA: Insufficient documentation

## 2017-02-25 DIAGNOSIS — Z79890 Hormone replacement therapy (postmenopausal): Secondary | ICD-10-CM | POA: Insufficient documentation

## 2017-02-25 DIAGNOSIS — Z96652 Presence of left artificial knee joint: Secondary | ICD-10-CM | POA: Insufficient documentation

## 2017-02-25 DIAGNOSIS — E7800 Pure hypercholesterolemia, unspecified: Secondary | ICD-10-CM | POA: Diagnosis not present

## 2017-02-25 DIAGNOSIS — I10 Essential (primary) hypertension: Secondary | ICD-10-CM | POA: Diagnosis present

## 2017-02-25 LAB — LIPID PANEL
CHOL/HDL RATIO: 3.3 ratio
CHOLESTEROL: 191 mg/dL (ref 0–200)
HDL: 58 mg/dL (ref >40)
LDL Cholesterol: 119 mg/dL — ABNORMAL HIGH (ref 0–99)
TRIGLYCERIDES: 69 mg/dL (ref <150)
VLDL: 14 mg/dL (ref 0–40)

## 2017-02-25 LAB — CBC WITH DIFFERENTIAL/PLATELET
BASOS PCT: 0 %
Basophils Absolute: 0 10*3/uL (ref 0.0–0.1)
EOS PCT: 1 %
Eosinophils Absolute: 0.1 10*3/uL (ref 0.0–0.7)
HEMATOCRIT: 38.5 % (ref 36.0–46.0)
Hemoglobin: 12.7 g/dL (ref 12.0–15.0)
LYMPHS PCT: 41 %
Lymphs Abs: 3.4 10*3/uL (ref 0.7–4.0)
MCH: 31.1 pg (ref 26.0–34.0)
MCHC: 33 g/dL (ref 30.0–36.0)
MCV: 94.1 fL (ref 78.0–100.0)
MONO ABS: 0.4 10*3/uL (ref 0.1–1.0)
Monocytes Relative: 5 %
NEUTROS ABS: 4.3 10*3/uL (ref 1.7–7.7)
Neutrophils Relative %: 53 %
PLATELETS: 232 10*3/uL (ref 150–400)
RBC: 4.09 MIL/uL (ref 3.87–5.11)
RDW: 13.6 % (ref 11.5–15.5)
WBC: 8.2 10*3/uL (ref 4.0–10.5)

## 2017-02-25 LAB — COMPREHENSIVE METABOLIC PANEL
ALT: 11 U/L — ABNORMAL LOW (ref 14–54)
ANION GAP: 12 (ref 5–15)
AST: 27 U/L (ref 15–41)
Albumin: 3.8 g/dL (ref 3.5–5.0)
Alkaline Phosphatase: 70 U/L (ref 38–126)
BUN: 25 mg/dL — ABNORMAL HIGH (ref 6–20)
CHLORIDE: 103 mmol/L (ref 101–111)
CO2: 21 mmol/L — AB (ref 22–32)
Calcium: 9.3 mg/dL (ref 8.9–10.3)
Creatinine, Ser: 1.4 mg/dL — ABNORMAL HIGH (ref 0.44–1.00)
GFR, EST AFRICAN AMERICAN: 44 mL/min — AB (ref >60)
GFR, EST NON AFRICAN AMERICAN: 38 mL/min — AB (ref >60)
Glucose, Bld: 178 mg/dL — ABNORMAL HIGH (ref 65–99)
POTASSIUM: 3.7 mmol/L (ref 3.5–5.1)
SODIUM: 136 mmol/L (ref 135–145)
Total Bilirubin: 1.2 mg/dL (ref 0.3–1.2)
Total Protein: 6.2 g/dL — ABNORMAL LOW (ref 6.5–8.1)

## 2017-02-25 LAB — TROPONIN I

## 2017-02-25 LAB — PROTIME-INR
INR: 0.98
Prothrombin Time: 12.9 seconds (ref 11.4–15.2)

## 2017-02-25 LAB — D-DIMER, QUANTITATIVE: D-Dimer, Quant: 0.57 ug/mL-FEU — ABNORMAL HIGH (ref 0.00–0.50)

## 2017-02-25 LAB — APTT: aPTT: 24 seconds (ref 24–36)

## 2017-02-25 MED ORDER — HEPARIN SODIUM (PORCINE) 5000 UNIT/ML IJ SOLN: 60.0000 [IU]/kg | Freq: Once | INTRAMUSCULAR | Status: DC

## 2017-02-25 MED ORDER — SODIUM CHLORIDE 0.9 % IV BOLUS (SEPSIS)
1000.0000 mL | Freq: Once | INTRAVENOUS | Status: AC
  Administered 2017-02-26: 1000 mL via INTRAVENOUS

## 2017-02-25 MED ORDER — SODIUM CHLORIDE 0.9 % IV SOLN: INTRAVENOUS | Status: DC

## 2017-02-25 MED ORDER — IOPAMIDOL (ISOVUE-370) INJECTION 76%
INTRAVENOUS | Status: AC
  Administered 2017-02-25: 100 mL
  Filled 2017-02-25: qty 100

## 2017-02-25 NOTE — ED Notes (Signed)
Patient transported to CT 

## 2017-02-25 NOTE — Progress Notes (Signed)
WAS  CALLED TO BEDSIDE  TO EVAL  AND  RESPONDED TO STEMI  PAGE,  66  YEAR  ODL  WITH  HTN  ADMITTED  WITH DIZZINESS,  AND  BRIEF  VASOVAGAL , LIKE  EPISODE, SUPRAPUBIC, LOWER  EPIGASTRIC  PAIN ,  AND  BRIEF  HYPOTENSIO , WHICH NORMALISED TO 120/70,  EKG - NSR, NO  ACS  OR  STEMI  FEATURES PT  HAD  NEVER  HAD  CHEST PAIN  D/W INTERVENTIONAL CARDIOLOGIST ON CALL, DR  Katrinka BlazingSMITH  Who agrees  With my  eval .  INFORMED  ER  PHYSICIAN , on call  And  nursign staff,  Enzymes, troponin  Ordered,  Pt does not  Need cardiac  Cath , and other  Causes of  Lower  Epigastric, abd  Pain  Er  Work up  Will be  Initiated  Per ER  STAFF,  PLEASE  CALL CARDIOLOGY IF  STILL NEEDED.

## 2017-02-25 NOTE — ED Provider Notes (Signed)
[facility] EMERGENCY DEPARTMENT Provider Note   CSN: [id number] Arrival date & time: 02/25/17  2114     History   Chief Complaint Chief Complaint  Patient presents with  . Chest Pain  . Loss of Consciousness    HPI Karen Horne is a 67 y.o. female.  Karen Horne is a 67 y.o. Female who presents to the ED after a syncopal episode prior to arrival today.  She reports she finished eating dinner this evening and when she walked outside she felt a pressure in her abdomen and felt lightheaded.  She sat down and then her sister reports that she passed out for several seconds while sitting. No injury.   She then came to.  No seizure-like activity.  She reports the abdominal pain has since resolved.  She reports that when standing up to get in the ambulance she felt lightheaded again and like she might pass out.  This is never happened to her before.  She had no chest pain or shortness of breath prior to or after the syncopal episode.  She reports feeling back to normal currently.  She does have a history of hypothyroidism and denies any changes to her medications recently.  She denies history of MI or PE.  She says she been eating and drinking normally.  She did have a recent left shoulder surgery about a month ago that she did well with.  She is still not raising her left arm above her head.  She denies fevers, vomiting, diarrhea, numbness, tingling, weakness, double vision, chest pain, coughing, shortness of breath, urinary symptoms or rashes.    The history is provided by the patient and a relative. No language interpreter was used.  Chest Pain   Associated symptoms include syncope. Pertinent negatives include no abdominal pain, no back pain, no cough, no dizziness, no fever, no headaches, no nausea, no numbness, no palpitations, no shortness of breath, no vomiting and no weakness.  Pertinent negatives for past medical history include no seizures.  Loss of Consciousness     Associated symptoms include light-headedness. Pertinent negatives include abdominal pain, back pain, chest pain, congestion, dizziness, fever, headaches, nausea, palpitations, seizures, vomiting and weakness.    Past Medical History:  Diagnosis Date  . Arthritis   . Chronic kidney disease pyelonephritis  . Depression   . Fatty liver   . GERD (gastroesophageal reflux disease)    surgery for acid reflux 10 yrs ago  . Hemorrhoids   . Hypercholesterolemia   . Hypertension   . Hypothyroidism   . IBS (irritable bowel syndrome)     Patient Active Problem List   Diagnosis Date Noted  . Syncope 02/26/2017  . Left knee DJD 09/17/2012    Class: Chronic  . HEMORRHOIDS, INTERNAL 08/17/2009  . HEMORRHAGE OF RECTUM AND ANUS 08/17/2009  . B12 DEFICIENCY 10/25/2007  . ADJ DISORDER WITH MIXED ANXIETY & DEPRESSED MOOD 10/22/2007  . GERD 10/22/2007  . IRRITABLE BOWEL SYNDROME 10/22/2007  . CHEST PAIN 10/22/2007  . ABDOMINAL PAIN RIGHT UPPER QUADRANT 10/22/2007  . HYPOTHYROIDISM 10/21/2007  . HYPERCHOLESTEROLEMIA 10/21/2007  . Essential hypertension 10/21/2007  . FATTY LIVER DISEASE 10/21/2007  . PYELONEPHRITIS 10/21/2007  . ABDOMINAL PAIN 10/21/2007    Past Surgical History:  Procedure Laterality Date  . arthroscopic knee Left   . reflux surgery    . TOTAL KNEE ARTHROPLASTY Left 09/17/2012   Procedure: TOTAL KNEE ARTHROPLASTY;  Surgeon: Velna Ochs, MD;  Location: [facility] OR;  Service:  Orthopedics;  Laterality: Left;  Left total knee replacement    OB History    No data available       Home Medications    Prior to Admission medications   Medication Sig Start Date End Date Taking? Authorizing Provider  atorvastatin (LIPITOR) 40 MG tablet Take 40 mg by mouth at bedtime.    Yes [provider]  levothyroxine (SYNTHROID, LEVOTHROID) 75 MCG tablet Take 75 mcg by mouth daily before breakfast.   Yes [provider]  lisinopril-hydrochlorothiazide  (PRINZIDE,ZESTORETIC) 20-12.5 MG per tablet Take 1 tablet by mouth daily.   Yes [provider]  naproxen sodium (ALEVE) 220 MG tablet Take 220 mg by mouth 2 (two) times daily as needed (pain).   Yes [provider]  venlafaxine XR (EFFEXOR-XR) 75 MG 24 hr capsule Take 75 mg by mouth at bedtime.   Yes [provider]    Family History History reviewed. No pertinent family history.  Social History Social History   Tobacco Use  . Smoking status: Never Smoker  . Smokeless tobacco: Never Used  Substance Use Topics  . Alcohol use: No  . Drug use: No     Allergies   Adhesive [tape]   Review of Systems Review of Systems  Constitutional: Negative for chills and fever.  HENT: Negative for congestion and sore throat.   Eyes: Negative for visual disturbance.  Respiratory: Negative for cough and shortness of breath.   Cardiovascular: Positive for syncope. Negative for chest pain, palpitations and leg swelling.  Gastrointestinal: Negative for abdominal pain, diarrhea, nausea and vomiting.  Genitourinary: Negative for dysuria.  Musculoskeletal: Negative for back pain and neck pain.  Skin: Negative for rash.  Neurological: Positive for syncope and light-headedness. Negative for dizziness, seizures, speech difficulty, weakness, numbness and headaches.     Physical Exam Updated Vital Signs BP 102/80   Pulse 66   Temp (!) 97.5 F (36.4 C) (Oral)   Resp 19   SpO2 97%   Physical Exam  Constitutional: She is oriented to person, place, and time. She appears well-developed and well-nourished.  Non-toxic appearance. She does not appear ill. No distress.  HENT:  Head: Normocephalic and atraumatic.  Mouth/Throat: Oropharynx is clear and moist.  Eyes: Conjunctivae and EOM are normal. Pupils are equal, round, and reactive to light. Right eye exhibits no discharge. Left eye exhibits no discharge.  Neck: Neck supple. No JVD present.  Cardiovascular: Normal rate,  regular rhythm, normal heart sounds and intact distal pulses. Exam reveals no gallop and no friction rub.  No murmur heard. Pulses:      Radial pulses are 2+ on the right side, and 2+ on the left side.       Dorsalis pedis pulses are 2+ on the right side, and 2+ on the left side.       Posterior tibial pulses are 2+ on the right side, and 2+ on the left side.  Pulmonary/Chest: Effort normal and breath sounds normal. No respiratory distress. She has no wheezes. She has no rales.  Lungs are clear to ascultation bilaterally. Symmetric chest expansion bilaterally. No increased work of breathing. No rales or rhonchi.    Abdominal: Soft. She exhibits no distension and no mass. There is no tenderness. There is no guarding.  Musculoskeletal: She exhibits no edema.       Right lower leg: Normal. She exhibits no tenderness and no edema.       Left lower leg: Normal. She exhibits no tenderness and  no edema.  Lymphadenopathy:    She has no cervical adenopathy.  Neurological: She is alert and oriented to person, place, and time. No cranial nerve deficit or sensory deficit. She exhibits normal muscle tone. Coordination normal.  Patient is alert and oriented 3. Speech is clear and coherent. Cranial nerves are intact. Sensation and strength is intact to bilateral upper and lower extremities. EOMs are intact. Vision is grossly intact.   Skin: Skin is warm and dry. Capillary refill takes less than 2 seconds. No rash noted. She is not diaphoretic. No erythema. No pallor.  Psychiatric: She has a normal mood and affect. Her behavior is normal.  Nursing note and vitals reviewed.    ED Treatments / Results  Labs (all labs ordered are listed, but only abnormal results are displayed) Labs Reviewed  COMPREHENSIVE METABOLIC PANEL - Abnormal; Notable for the following components:      Result Value   CO2 21 (*)    Glucose, Bld 178 (*)    BUN 25 (*)    Creatinine, Ser 1.40 (*)    Total Protein 6.2 (*)    ALT 11  (*)    GFR calc non Af Amer 38 (*)    GFR calc Af Amer 44 (*)    All other components within normal limits  LIPID PANEL - Abnormal; Notable for the following components:   LDL Cholesterol 119 (*)    All other components within normal limits  D-DIMER, QUANTITATIVE (NOT AT [facility]) - Abnormal; Notable for the following components:   D-Dimer, Quant 0.57 (*)    All other components within normal limits  URINALYSIS, ROUTINE W REFLEX MICROSCOPIC - Abnormal; Notable for the following components:   APPearance HAZY (*)    Specific Gravity, Urine 1.040 (*)    Leukocytes, UA TRACE (*)    Squamous Epithelial / LPF 0-5 (*)    All other components within normal limits  CBC WITH DIFFERENTIAL/PLATELET  PROTIME-INR  APTT  TROPONIN I    EKG   EKG Interpretation  Date/Time:  Sunday February 25 2017 21:20:43 EST Ventricular Rate:  73 PR Interval:    QRS Duration: 111 QT Interval:  421 QTC Calculation: 464 R Axis:   41 Text Interpretation:  Sinus rhythm Borderline low voltage, extremity leads Confirmed by Palumbo, April ([id number]) on 02/26/2017 12:46:36 AM       Radiology Dg Chest 2 View  Result Date: 02/25/2017 CLINICAL DATA:  Syncopal episode at dinner tonight. EXAM: CHEST  2 VIEW COMPARISON:  09/10/2012 FINDINGS: The heart size and mediastinal contours are within normal limits. Both lungs are clear. The visualized skeletal structures are unremarkable. IMPRESSION: No active cardiopulmonary disease. Electronically Signed   By: Burman Nieves M.D.   On: 02/25/2017 23:08   Ct Angio Chest Pe W/cm &/or Wo Cm  Result Date: 02/26/2017 CLINICAL DATA:  Syncopal episode at dinner tonight. Positive D-dimer. EXAM: CT ANGIOGRAPHY CHEST WITH CONTRAST TECHNIQUE: Multidetector CT imaging of the chest was performed using the standard protocol during bolus administration of intravenous contrast. Multiplanar CT image reconstructions and MIPs were obtained to evaluate the vascular anatomy. CONTRAST:  ISOVUE-370  IOPAMIDOL (ISOVUE-370) INJECTION 76% COMPARISON:  None. FINDINGS: Cardiovascular: There is good opacification of the segmental and central pulmonary arteries. No focal filling defects. No evidence of significant pulmonary embolus. Normal caliber thoracic aorta. Normal heart size. No pericardial effusion. Mediastinum/Nodes: Esophagus is decompressed. No significant lymphadenopathy in the chest. Surgical clips at the EG junction. Lungs/Pleura: Patchy ground-glass mosaic attenuation changes throughout  the right lung and in the left lung base. This may be due to motion artifact, air trapping, or edema. No pleural effusions. No pneumothorax. No focal consolidation. Airways are patent. Upper Abdomen: No acute process demonstrated in the visualized upper abdomen. Musculoskeletal: No chest wall abnormality. No acute or significant osseous findings. Review of the MIP images confirms the above findings. IMPRESSION: 1. No evidence of significant pulmonary embolus. 2. Patchy ground-glass attenuation changes throughout the right lung and in the left lung base. This may be due to motion artifact, air trapping, or edema. 3. Surgical clips at the EG junction. Electronically Signed   By: Burman Nieves M.D.   On: 02/26/2017 00:53    Procedures Procedures (including critical care time)  Medications Ordered in ED Medications  sodium chloride flush (NS) 0.9 % injection 3 mL (not administered)  enoxaparin (LOVENOX) injection 40 mg (not administered)  atorvastatin (LIPITOR) tablet 40 mg (not administered)  levothyroxine (SYNTHROID, LEVOTHROID) tablet 75 mcg (not administered)  venlafaxine XR (EFFEXOR-XR) 24 hr capsule 75 mg (not administered)  sodium chloride 0.9 % bolus 1,000 mL (0 mLs Intravenous Stopped 02/26/17 0104)  iopamidol (ISOVUE-370) 76 % injection (100 mLs  Contrast Given 02/25/17 2338)     Initial Impression / Assessment and Plan / ED Course  I have reviewed the triage vital signs and the nursing  notes.  Pertinent labs & imaging results that were available during my care of the patient were reviewed by me and considered in my medical decision making (see chart for details).    This is a 67 y.o. Female who presents to the ED after a syncopal episode prior to arrival today.  She reports she finished eating dinner this evening and when she walked outside she felt a pressure in her abdomen and felt lightheaded.  She sat down and then her sister reports that she passed out for several seconds while sitting. No injury.   She then came to.  No seizure-like activity.  She reports the abdominal pain has since resolved.  She reports that when standing up to get in the ambulance she felt lightheaded again and like she might pass out.  This is never happened to her before.  She had no chest pain or shortness of breath prior to or after the syncopal episode.  She reports feeling back to normal currently.  She does have a history of hypothyroidism and denies any changes to her medications recently.  She denies history of MI or PE.  She says she been eating and drinking normally.  She did have a recent left shoulder surgery about a month ago that she did well with.  She is still not raising her left arm above her head. On exam the patient is afebrile and non-toxic appearing.  She has no focal neurological deficits.  Lungs are clear to auscultation bilaterally.  Abdomen is soft and nontender. EKG shows NSR.  Urinalysis is nitrite negative with trace leukocytes.  Specific gravity is 1.040.  CBC is within normal limits.  Initial troponin is not elevated.  CMP is remarkable for a creatinine of 1.40.  This is almost a twofold increase from her baseline. Patient is dehydrated. CXR is unremarkable.  D-dimer was obtained due to patient's recent surgery and syncope.  This was elevated at 0.57. She agrees with plan for CTA chest.  CTA chest shows no evidence of PE. Will plan for admission for syncope and dehydration. Will  recheck labs and troponin as inpatient. Patient and family agrees  with plan for admission.   I consulted with Dr. Julian Reil who accepted the patient for admission.   This patient was discussed with Dr. Corlis Leak who agrees with assessment and plan.   Final Clinical Impressions(s) / ED Diagnoses   Final diagnoses:  Syncope and collapse  Dehydration  AKI (acute kidney injury) [facility])    ED Discharge Orders    None       Everlene Farrier, PA-C 02/26/17 0152    Abelino Derrick, MD 02/28/17 [phone]

## 2017-02-25 NOTE — ED Triage Notes (Signed)
Pt here via [facility] with c/o syncopal episode while at dinner tonight. States that she started feeling "funny" and went outside and had a syncopal episode. Pt denies having any chest pain or nausea or sob. Pain free at this time. EMS reports elevation on EKG in leads 2,3,and AVF. Pt had 250 ml bolus and 324mg  ASA. Pt alert and orient x 4 at this time.

## 2017-02-25 NOTE — ED Notes (Signed)
Dr. Lynwood DawleyAmeeth Vedre from Cardiology at bedside.

## 2017-02-25 NOTE — ED Notes (Signed)
Pt transported to xray 

## 2017-02-26 ENCOUNTER — Other Ambulatory Visit: Payer: Self-pay

## 2017-02-26 ENCOUNTER — Observation Stay (HOSPITAL_BASED_OUTPATIENT_CLINIC_OR_DEPARTMENT_OTHER): Payer: 59

## 2017-02-26 DIAGNOSIS — E860 Dehydration: Secondary | ICD-10-CM

## 2017-02-26 DIAGNOSIS — I10 Essential (primary) hypertension: Secondary | ICD-10-CM

## 2017-02-26 DIAGNOSIS — R55 Syncope and collapse: Secondary | ICD-10-CM

## 2017-02-26 DIAGNOSIS — N179 Acute kidney failure, unspecified: Secondary | ICD-10-CM | POA: Diagnosis not present

## 2017-02-26 LAB — URINALYSIS, ROUTINE W REFLEX MICROSCOPIC
BACTERIA UA: NONE SEEN
Bilirubin Urine: NEGATIVE
GLUCOSE, UA: NEGATIVE mg/dL
HGB URINE DIPSTICK: NEGATIVE
KETONES UR: NEGATIVE mg/dL
NITRITE: NEGATIVE
PROTEIN: NEGATIVE mg/dL
Specific Gravity, Urine: 1.04 — ABNORMAL HIGH (ref 1.005–1.030)
pH: 5 (ref 5.0–8.0)

## 2017-02-26 LAB — BASIC METABOLIC PANEL
ANION GAP: 12 (ref 5–15)
BUN: 26 mg/dL — ABNORMAL HIGH (ref 6–20)
CO2: 20 mmol/L — AB (ref 22–32)
Calcium: 8.9 mg/dL (ref 8.9–10.3)
Chloride: 105 mmol/L (ref 101–111)
Creatinine, Ser: 1.08 mg/dL — ABNORMAL HIGH (ref 0.44–1.00)
GFR calc non Af Amer: 52 mL/min — ABNORMAL LOW (ref >60)
GLUCOSE: 102 mg/dL — AB (ref 65–99)
POTASSIUM: 3.7 mmol/L (ref 3.5–5.1)
Sodium: 137 mmol/L (ref 135–145)

## 2017-02-26 LAB — CBC
HEMATOCRIT: 37 % (ref 36.0–46.0)
HEMOGLOBIN: 11.9 g/dL — AB (ref 12.0–15.0)
MCH: 30.5 pg (ref 26.0–34.0)
MCHC: 32.2 g/dL (ref 30.0–36.0)
MCV: 94.9 fL (ref 78.0–100.0)
Platelets: 183 10*3/uL (ref 150–400)
RBC: 3.9 MIL/uL (ref 3.87–5.11)
RDW: 13.8 % (ref 11.5–15.5)
WBC: 7.1 10*3/uL (ref 4.0–10.5)

## 2017-02-26 LAB — TSH: TSH: 3.159 u[IU]/mL (ref 0.350–4.500)

## 2017-02-26 LAB — ECHOCARDIOGRAM COMPLETE

## 2017-02-26 MED ORDER — LEVOTHYROXINE SODIUM 75 MCG PO TABS
75.0000 ug | ORAL_TABLET | Freq: Every day | ORAL | Status: DC
  Administered 2017-02-26: 75 ug via ORAL
  Filled 2017-02-26: qty 1

## 2017-02-26 MED ORDER — ATORVASTATIN CALCIUM 40 MG PO TABS
40.0000 mg | ORAL_TABLET | Freq: Every day | ORAL | Status: DC
  Filled 2017-02-26: qty 1

## 2017-02-26 MED ORDER — SODIUM CHLORIDE 0.9% FLUSH
3.0000 mL | Freq: Two times a day (BID) | INTRAVENOUS | Status: DC
  Administered 2017-02-26 (×2): 3 mL via INTRAVENOUS

## 2017-02-26 MED ORDER — ENOXAPARIN SODIUM 40 MG/0.4ML ~~LOC~~ SOLN
40.0000 mg | SUBCUTANEOUS | Status: DC
  Administered 2017-02-26: 40 mg via SUBCUTANEOUS
  Filled 2017-02-26 (×2): qty 0.4

## 2017-02-26 MED ORDER — ATORVASTATIN CALCIUM 40 MG PO TABS: 40.0000 mg | ORAL_TABLET | Freq: Every day | ORAL | Status: DC

## 2017-02-26 MED ORDER — VENLAFAXINE HCL ER 75 MG PO CP24: 75.0000 mg | ORAL_CAPSULE | Freq: Every day | ORAL | Status: DC

## 2017-02-26 NOTE — H&P (Signed)
History and Physical    Karen Horne ZOX:[id number] DOB: Mar 20, 1950 DOA: 02/25/2017  PCP: Lovenia KimHepler, Mark, PA-C  Patient coming from: Home  I have personally briefly reviewed patient's [facility]  Chief Complaint: Syncope  HPI: Karen Horne is a 67 y.o. female with medical history significant of HTN, HLD.  Patient presents to the ED with c/o syncope episode.  Had just finished eating dinner.  Walked outside, felt pressure in abdomen, felt lightheaded.  Sat down, sister reports she passed out for several seconds.  No seizure like activity.  She then came to.  Abd pain has since resolved.  Was lightheaded on standing again when getting up to go to ambulance.  No CP, no SOB, prior to, during, or after syncope.   ED Course: EDP is concerned because her creat is 1.4 up from 0.8 in 2014 and wants her admitted for syncope work up.   Review of Systems: As per HPI otherwise 10 point review of systems negative.   Past Medical History:  Diagnosis Date  . Arthritis   . Chronic kidney disease pyelonephritis  . Depression   . Fatty liver   . GERD (gastroesophageal reflux disease)    surgery for acid reflux 10 yrs ago  . Hemorrhoids   . Hypercholesterolemia   . Hypertension   . Hypothyroidism   . IBS (irritable bowel syndrome)     Past Surgical History:  Procedure Laterality Date  . arthroscopic knee Left   . reflux surgery    . TOTAL KNEE ARTHROPLASTY Left 09/17/2012   Procedure: TOTAL KNEE ARTHROPLASTY;  Surgeon: Velna OchsPeter G Dalldorf, MD;  Location: [facility] OR;  Service: Orthopedics;  Laterality: Left;  Left total knee replacement     reports that  has never smoked. she has never used smokeless tobacco. She reports that she does not drink alcohol or use drugs.  Allergies  Allergen Reactions  . Adhesive [Tape] Rash    History reviewed. No pertinent family history.   Prior to Admission medications   Medication Sig Start Date End Date Taking? Authorizing  Provider  atorvastatin (LIPITOR) 40 MG tablet Take 40 mg by mouth at bedtime.    Yes [provider]  levothyroxine (SYNTHROID, LEVOTHROID) 75 MCG tablet Take 75 mcg by mouth daily before breakfast.   Yes [provider]  lisinopril-hydrochlorothiazide (PRINZIDE,ZESTORETIC) 20-12.5 MG per tablet Take 1 tablet by mouth daily.   Yes [provider]  naproxen sodium (ALEVE) 220 MG tablet Take 220 mg by mouth 2 (two) times daily as needed (pain).   Yes [provider]  venlafaxine XR (EFFEXOR-XR) 75 MG 24 hr capsule Take 75 mg by mouth at bedtime.   Yes [provider]    Physical Exam: Vitals:   02/25/17 2230 02/26/17 0015 02/26/17 0045 02/26/17 0115  BP: 108/65 128/69 113/66 102/80  Pulse: 71 74 70 66  Resp: 16 19 15 19   Temp:      TempSrc:      SpO2: 100% 99% 99% 97%    Constitutional: NAD, calm, comfortable Eyes: PERRL, lids and conjunctivae normal ENMT: Mucous membranes are moist. Posterior pharynx clear of any exudate or lesions.Normal dentition.  Neck: normal, supple, no masses, no thyromegaly Respiratory: clear to auscultation bilaterally, no wheezing, no crackles. Normal respiratory effort. No accessory muscle use.  Cardiovascular: Regular rate and rhythm, no murmurs / rubs / gallops. No extremity edema. 2+ pedal pulses. No carotid bruits.  Abdomen: no tenderness, no masses palpated. No  hepatosplenomegaly. Bowel sounds positive.  Musculoskeletal: no clubbing / cyanosis. No joint deformity upper and lower extremities. Good ROM, no contractures. Normal muscle tone.  Skin: no rashes, lesions, ulcers. No induration Neurologic: CN 2-12 grossly intact. Sensation intact, DTR normal. Strength 5/5 in all 4.  Psychiatric: Normal judgment and insight. Alert and oriented x 3. Normal mood.    Labs on Admission: I have personally reviewed following labs and imaging studies  CBC: Recent Labs  Lab 02/25/17 2124  WBC 8.2  NEUTROABS 4.3  HGB  12.7  HCT 38.5  MCV 94.1  PLT 232   Basic Metabolic Panel: Recent Labs  Lab 02/25/17 2124  NA 136  K 3.7  CL 103  CO2 21*  GLUCOSE 178*  BUN 25*  CREATININE 1.40*  CALCIUM 9.3   GFR: CrCl cannot be calculated (Unknown ideal weight.). Liver Function Tests: Recent Labs  Lab 02/25/17 2124  AST 27  ALT 11*  ALKPHOS 70  BILITOT 1.2  PROT 6.2*  ALBUMIN 3.8   No results for input(s): LIPASE, AMYLASE in the last 168 hours. No results for input(s): AMMONIA in the last 168 hours. Coagulation Profile: Recent Labs  Lab 02/25/17 2124  INR 0.98   Cardiac Enzymes: Recent Labs  Lab 02/25/17 2124  TROPONINI <0.03   BNP (last 3 results) No results for input(s): PROBNP in the last 8760 hours. HbA1C: No results for input(s): HGBA1C in the last 72 hours. CBG: No results for input(s): GLUCAP in the last 168 hours. Lipid Profile: Recent Labs    02/25/17 2124  CHOL 191  HDL 58  LDLCALC 119*  TRIG 69  CHOLHDL 3.3   Thyroid Function Tests: No results for input(s): TSH, T4TOTAL, FREET4, T3FREE, THYROIDAB in the last 72 hours. Anemia Panel: No results for input(s): VITAMINB12, FOLATE, FERRITIN, TIBC, IRON, RETICCTPCT in the last 72 hours. Urine analysis:    Component Value Date/Time   COLORURINE YELLOW 02/26/2017 0030   APPEARANCEUR HAZY (A) 02/26/2017 0030   LABSPEC 1.040 (H) 02/26/2017 0030   PHURINE 5.0 02/26/2017 0030   GLUCOSEU NEGATIVE 02/26/2017 0030   HGBUR NEGATIVE 02/26/2017 0030   BILIRUBINUR NEGATIVE 02/26/2017 0030   KETONESUR NEGATIVE 02/26/2017 0030   PROTEINUR NEGATIVE 02/26/2017 0030   UROBILINOGEN 0.2 09/10/2012 1231   NITRITE NEGATIVE 02/26/2017 0030   LEUKOCYTESUR TRACE (A) 02/26/2017 0030    Radiological Exams on Admission: Dg Chest 2 View  Result Date: 02/25/2017 CLINICAL DATA:  Syncopal episode at dinner tonight. EXAM: CHEST  2 VIEW COMPARISON:  09/10/2012 FINDINGS: The heart size and mediastinal contours are within normal limits. Both  lungs are clear. The visualized skeletal structures are unremarkable. IMPRESSION: No active cardiopulmonary disease. Electronically Signed   By: Burman Nieves M.D.   On: 02/25/2017 23:08   Ct Angio Chest Pe W/cm &/or Wo Cm  Result Date: 02/26/2017 CLINICAL DATA:  Syncopal episode at dinner tonight. Positive D-dimer. EXAM: CT ANGIOGRAPHY CHEST WITH CONTRAST TECHNIQUE: Multidetector CT imaging of the chest was performed using the standard protocol during bolus administration of intravenous contrast. Multiplanar CT image reconstructions and MIPs were obtained to evaluate the vascular anatomy. CONTRAST:  ISOVUE-370 IOPAMIDOL (ISOVUE-370) INJECTION 76% COMPARISON:  None. FINDINGS: Cardiovascular: There is good opacification of the segmental and central pulmonary arteries. No focal filling defects. No evidence of significant pulmonary embolus. Normal caliber thoracic aorta. Normal heart size. No pericardial effusion. Mediastinum/Nodes: Esophagus is decompressed. No significant lymphadenopathy in the chest. Surgical clips at the EG junction. Lungs/Pleura: Patchy ground-glass mosaic attenuation  changes throughout the right lung and in the left lung base. This may be due to motion artifact, air trapping, or edema. No pleural effusions. No pneumothorax. No focal consolidation. Airways are patent. Upper Abdomen: No acute process demonstrated in the visualized upper abdomen. Musculoskeletal: No chest wall abnormality. No acute or significant osseous findings. Review of the MIP images confirms the above findings. IMPRESSION: 1. No evidence of significant pulmonary embolus. 2. Patchy ground-glass attenuation changes throughout the right lung and in the left lung base. This may be due to motion artifact, air trapping, or edema. 3. Surgical clips at the EG junction. Electronically Signed   By: Burman Nieves M.D.   On: 02/26/2017 00:53    EKG: Independently reviewed.  Assessment/Plan Principal Problem:    Syncope Active Problems:   Essential hypertension    1. Syncope - likely vasovagal 1. Syncope pathway 2. 2d echo 3. Tele monitor 2. HTN - 1. Holding lisinopril-hctz as BP running consistently low 100s here in ED. 3. Creat of 1.4 - acute vs chronic? 1. Up from 0.8 in 2014, no labs in intervening time. 2. Repeat BMP in AM 3. Hold lisinopril-hctz as above 4. Hypothyroidism - 1. Cont synthroid 2. TSH pending  DVT prophylaxis: Lovenox Code Status: Full Family Communication: Family at bedside Disposition Plan: Home after admit Consults called: None Admission status: Place in [place], [place]. DO [facility] Pager [phone]  If 7AM-7PM, please contact day team taking care of patient [email or web address] Password [facility]  02/26/2017, 1:41 AM

## 2017-02-26 NOTE — Progress Notes (Signed)
  Echocardiogram 2D Echocardiogram has been performed.  Karen SavoyCasey N Kenyatta Horne 02/26/2017, 11:08 AM

## 2017-02-26 NOTE — ED Notes (Signed)
ED Provider at bedside. 

## 2017-02-26 NOTE — Discharge Summary (Signed)
Physician Discharge Summary  Karen Horne ZOX:[id number] DOB: Nov 10, 1950 DOA: 02/25/2017  PCP: Marva PandaMillsaps, Kimberly, NP  Admit date: 02/25/2017 Discharge date: 02/26/2017   Recommendations for Outpatient Follow-Up:   1. BP low/normal in hospital off BP medication-- echo shows grade 2 diastolic CHF-- may need prn lasix for fluid control in future, to bring BP log next week at appointment    Discharge Diagnosis:   Principal Problem:   Syncope Active Problems:   Essential hypertension   Discharge disposition:  Home  Discharge Condition: Improved.  Diet recommendation: Low sodium, heart healthy Wound care: None.   History of Present Illness:   Karen SpenceJoyce I Niemeier is a 67 y.o. female with medical history significant of HTN, HLD.  Patient presents to the ED with c/o syncope episode.  Had just finished eating dinner.  Walked outside, felt pressure in abdomen, felt lightheaded.  Sat down, sister reports she passed out for several seconds.  No seizure like activity.  She then came to.  Abd pain has since resolved.  Was lightheaded on standing again when getting up to go to ambulance.     Hospital Course by Problem:   vasovagal syncope -echo:- Normal LV size and systolic function, EF 60-65%. Moderate   diastolic dysfunction. Normal RV size and systolic function. No   significant valvular abnormalities.  -no abnormalities on tele -CTA negative for PE  AKI -improved with IVF -hold BP medications  Hypothyroid -continue synthroid  Medical Consultants:    None.   Discharge Exam:   Vitals:   02/26/17 1100 02/26/17 1232  BP: 106/72 123/73  Pulse: 75 69  Resp: 14 14  Temp:  98.3 F (36.8 C)  SpO2: 97%    Vitals:   02/26/17 0800 02/26/17 0900 02/26/17 1100 02/26/17 1232  BP: 114/64 123/66 106/72 123/73  Pulse: 67 62 75 69  Resp:  14 14 14   Temp:    98.3 F (36.8 C)  TempSrc:    Oral  SpO2: 98% 98% 97%     Gen:  NAD    The results of significant diagnostics  from this hospitalization (including imaging, microbiology, ancillary and laboratory) are listed below for reference.     Procedures and Diagnostic Studies:   Dg Chest 2 View  Result Date: 02/25/2017 CLINICAL DATA:  Syncopal episode at dinner tonight. EXAM: CHEST  2 VIEW COMPARISON:  09/10/2012 FINDINGS: The heart size and mediastinal contours are within normal limits. Both lungs are clear. The visualized skeletal structures are unremarkable. IMPRESSION: No active cardiopulmonary disease. Electronically Signed   By: Burman NievesWilliam  Stevens M.D.   On: 02/25/2017 23:08   Ct Angio Chest Pe W/cm &/or Wo Cm  Result Date: 02/26/2017 CLINICAL DATA:  Syncopal episode at dinner tonight. Positive D-dimer. EXAM: CT ANGIOGRAPHY CHEST WITH CONTRAST TECHNIQUE: Multidetector CT imaging of the chest was performed using the standard protocol during bolus administration of intravenous contrast. Multiplanar CT image reconstructions and MIPs were obtained to evaluate the vascular anatomy. CONTRAST:  100mL ISOVUE-370 IOPAMIDOL (ISOVUE-370) INJECTION 76% COMPARISON:  None. FINDINGS: Cardiovascular: There is good opacification of the segmental and central pulmonary arteries. No focal filling defects. No evidence of significant pulmonary embolus. Normal caliber thoracic aorta. Normal heart size. No pericardial effusion. Mediastinum/Nodes: Esophagus is decompressed. No significant lymphadenopathy in the chest. Surgical clips at the EG junction. Lungs/Pleura: Patchy ground-glass mosaic attenuation changes throughout the right lung and in the left lung base. This may be due to motion artifact, air trapping, or edema. No pleural effusions. No  pneumothorax. No focal consolidation. Airways are patent. Upper Abdomen: No acute process demonstrated in the visualized upper abdomen. Musculoskeletal: No chest wall abnormality. No acute or significant osseous findings. Review of the MIP images confirms the above findings. IMPRESSION: 1. No evidence  of significant pulmonary embolus. 2. Patchy ground-glass attenuation changes throughout the right lung and in the left lung base. This may be due to motion artifact, air trapping, or edema. 3. Surgical clips at the EG junction. Electronically Signed   By: Burman Nieves M.D.   On: 02/26/2017 00:53     Labs:   Basic Metabolic Panel: Recent Labs  Lab 02/25/17 2124 02/26/17 0524  NA 136 137  K 3.7 3.7  CL 103 105  CO2 21* 20*  GLUCOSE 178* 102*  BUN 25* 26*  CREATININE 1.40* 1.08*  CALCIUM 9.3 8.9   GFR CrCl cannot be calculated (Unknown ideal weight.). Liver Function Tests: Recent Labs  Lab 02/25/17 2124  AST 27  ALT 11*  ALKPHOS 70  BILITOT 1.2  PROT 6.2*  ALBUMIN 3.8   No results for input(s): LIPASE, AMYLASE in the last 168 hours. No results for input(s): AMMONIA in the last 168 hours. Coagulation profile Recent Labs  Lab 02/25/17 2124  INR 0.98    CBC: Recent Labs  Lab 02/25/17 2124 02/26/17 0524  WBC 8.2 7.1  NEUTROABS 4.3  --   HGB 12.7 11.9*  HCT 38.5 37.0  MCV 94.1 94.9  PLT 232 183   Cardiac Enzymes: Recent Labs  Lab 02/25/17 2124  TROPONINI <0.03   BNP: Invalid input(s): POCBNP CBG: No results for input(s): GLUCAP in the last 168 hours. D-Dimer Recent Labs    02/25/17 2124  DDIMER 0.57*   Hgb A1c No results for input(s): HGBA1C in the last 72 hours. Lipid Profile Recent Labs    02/25/17 2124  CHOL 191  HDL 58  LDLCALC 119*  TRIG 69  CHOLHDL 3.3   Thyroid function studies Recent Labs    02/26/17 0524  TSH 3.159   Anemia work up No results for input(s): VITAMINB12, FOLATE, FERRITIN, TIBC, IRON, RETICCTPCT in the last 72 hours. Microbiology No results found for this or any previous visit (from the past 240 hour(s)).   Discharge Instructions:   Discharge Instructions    Diet - low sodium heart healthy   Complete by:  As directed    Discharge instructions   Complete by:  As directed    BP here has been  low/normal off BP medications, PCP may consider a PRN diuretic such as lasix in the future with swelling.  Please bring log of home BP readings to PCP next week-- check 1x daily at different times   Increase activity slowly   Complete by:  As directed      Allergies as of 02/26/2017      Reactions   Adhesive [tape] Rash      Medication List    STOP taking these medications   lisinopril-hydrochlorothiazide 20-12.5 MG tablet Commonly known as:  PRINZIDE,ZESTORETIC   naproxen sodium 220 MG tablet Commonly known as:  ALEVE     TAKE these medications   atorvastatin 40 MG tablet Commonly known as:  LIPITOR Take 40 mg by mouth at bedtime.   levothyroxine 75 MCG tablet Commonly known as:  SYNTHROID, LEVOTHROID Take 75 mcg by mouth daily before breakfast.   venlafaxine XR 75 MG 24 hr capsule Commonly known as:  EFFEXOR-XR Take 75 mg by mouth at bedtime.  Follow-up Information    Marva Panda, NP Follow up in 1 week(s).   Contact information: [facility] [address] [place] [address] [phone]            Time coordinating discharge: 35 min  Signed:  Joseph Art   [facility] 02/26/2017, 3:38 PM

## 2017-02-26 NOTE — Progress Notes (Signed)
Plan for d/c as soon as echo read by cardiology

## 2017-11-26 ENCOUNTER — Encounter: Payer: Self-pay | Admitting: Gastroenterology

## 2019-02-27 ENCOUNTER — Ambulatory Visit: Payer: 59

## 2019-03-20 ENCOUNTER — Ambulatory Visit: Payer: 59

## 2019-08-22 ENCOUNTER — Ambulatory Visit
Admission: RE | Admit: 2019-08-22 | Discharge: 2019-08-22 | Disposition: A | Discharge status: Home / Self Care | Payer: Medicare Other | Source: Ambulatory Visit | Attending: Family Medicine | Admitting: Family Medicine

## 2019-08-22 ENCOUNTER — Other Ambulatory Visit: Payer: Self-pay

## 2019-08-22 ENCOUNTER — Other Ambulatory Visit: Payer: Self-pay | Admitting: Family Medicine

## 2019-08-22 DIAGNOSIS — W19XXXA Unspecified fall, initial encounter: Secondary | ICD-10-CM

## 2020-07-30 ENCOUNTER — Other Ambulatory Visit: Payer: Self-pay

## 2020-07-30 ENCOUNTER — Other Ambulatory Visit (HOSPITAL_BASED_OUTPATIENT_CLINIC_OR_DEPARTMENT_OTHER): Payer: Self-pay

## 2020-07-30 ENCOUNTER — Emergency Department (HOSPITAL_BASED_OUTPATIENT_CLINIC_OR_DEPARTMENT_OTHER)
Admission: EM | Admit: 2020-07-30 | Discharge: 2020-07-30 | Disposition: A | Discharge status: Home / Self Care | Payer: Medicare Other | Attending: Emergency Medicine | Admitting: Emergency Medicine

## 2020-07-30 ENCOUNTER — Encounter (HOSPITAL_BASED_OUTPATIENT_CLINIC_OR_DEPARTMENT_OTHER): Payer: Self-pay

## 2020-07-30 DIAGNOSIS — J101 Influenza due to other identified influenza virus with other respiratory manifestations: Secondary | ICD-10-CM | POA: Insufficient documentation

## 2020-07-30 DIAGNOSIS — E039 Hypothyroidism, unspecified: Secondary | ICD-10-CM | POA: Insufficient documentation

## 2020-07-30 DIAGNOSIS — Z96652 Presence of left artificial knee joint: Secondary | ICD-10-CM | POA: Insufficient documentation

## 2020-07-30 DIAGNOSIS — I129 Hypertensive chronic kidney disease with stage 1 through stage 4 chronic kidney disease, or unspecified chronic kidney disease: Secondary | ICD-10-CM | POA: Diagnosis not present

## 2020-07-30 DIAGNOSIS — R059 Cough, unspecified: Secondary | ICD-10-CM | POA: Diagnosis present

## 2020-07-30 DIAGNOSIS — J111 Influenza due to unidentified influenza virus with other respiratory manifestations: Secondary | ICD-10-CM

## 2020-07-30 DIAGNOSIS — N189 Chronic kidney disease, unspecified: Secondary | ICD-10-CM | POA: Diagnosis not present

## 2020-07-30 DIAGNOSIS — Z79899 Other long term (current) drug therapy: Secondary | ICD-10-CM | POA: Insufficient documentation

## 2020-07-30 DIAGNOSIS — Z20822 Contact with and (suspected) exposure to covid-19: Secondary | ICD-10-CM | POA: Insufficient documentation

## 2020-07-30 LAB — RESP PANEL BY RT-PCR (FLU A&B, COVID) ARPGX2
Influenza A by PCR: POSITIVE — AB
Influenza B by PCR: NEGATIVE
SARS Coronavirus 2 by RT PCR: NEGATIVE

## 2020-07-30 MED ORDER — ONDANSETRON HCL 4 MG PO TABS
4.0000 mg | ORAL_TABLET | Freq: Four times a day (QID) | ORAL | 0 refills | Status: DC
  Filled 2020-07-30: qty 12, 3d supply, fill #0

## 2020-07-30 MED ORDER — OSELTAMIVIR PHOSPHATE 75 MG PO CAPS
75.0000 mg | ORAL_CAPSULE | Freq: Two times a day (BID) | ORAL | 0 refills | Status: DC
  Filled 2020-07-30: qty 10, 5d supply, fill #0

## 2020-07-30 MED ORDER — OSELTAMIVIR PHOSPHATE 75 MG PO CAPS: 75.0000 mg | ORAL_CAPSULE | Freq: Two times a day (BID) | ORAL | 0 refills | Status: DC

## 2020-07-30 NOTE — ED Provider Notes (Signed)
[facility] EMERGENCY DEPARTMENT Provider Note   CSN: [id number] Arrival date & time: 07/30/20  1452     History Chief Complaint  Patient presents with   Cough    Karen Horne is a 70 y.o. female.  HPI  Patient with significant medical history of CKD, fatty liver, GERD, hypertension, IBS presents with chief complaint of URI-like symptoms.  Patient states symptoms started yesterday, endorses headaches, fevers, chills, nasal congestion, productive cough. Patient had nausea and vomiting but this has since resolved.  Patient states she is up-to-date on her COVID and influenza vaccine, is not immunocompromise, denies recent sick contacts.  Patient denies chest pain, shortness of breath, is tolerating p.o. without difficulty.  She has no other complaints at this time.   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    Class: Chronic   HEMORRHOIDS, INTERNAL 08/17/2009   HEMORRHAGE OF RECTUM AND ANUS 08/17/2009   B12 DEFICIENCY 10/25/2007   ADJ DISORDER WITH MIXED ANXIETY & DEPRESSED MOOD 10/22/2007   GERD 10/22/2007   IRRITABLE BOWEL SYNDROME 10/22/2007   CHEST PAIN 10/22/2007   ABDOMINAL PAIN RIGHT UPPER QUADRANT 10/22/2007   HYPOTHYROIDISM 10/21/2007   HYPERCHOLESTEROLEMIA 10/21/2007   Essential hypertension 10/21/2007   FATTY LIVER DISEASE 10/21/2007   PYELONEPHRITIS 10/21/2007   ABDOMINAL PAIN 10/21/2007    Past Surgical History:  Procedure Laterality Date   arthroscopic knee Left    reflux surgery     TOTAL KNEE ARTHROPLASTY Left 09/17/2012   Procedure: TOTAL KNEE ARTHROPLASTY;  Surgeon: Velna Ochs, MD;  Location: [facility] OR;  Service: Orthopedics;  Laterality: Left;  Left total knee replacement     OB History   No obstetric history on file.     No family history on file.  Social History   Tobacco Use   Smoking status: Never   Smokeless tobacco: Never  Vaping Use   Vaping Use: Never used  Substance Use Topics   Alcohol use: No   Drug use: No    Home Medications Prior to Admission medications   Medication Sig Start Date End Date Taking? Authorizing Provider  ondansetron (ZOFRAN) 4 MG tablet Take 1 tablet (4 mg total) by mouth every 6 (six) hours. 07/30/20  Yes Carroll Sage, PA-C  atorvastatin (LIPITOR) 40 MG tablet Take 40 mg by mouth at bedtime.     [provider]  levothyroxine (SYNTHROID, LEVOTHROID) 75 MCG tablet Take 75 mcg by mouth daily before breakfast.    [provider]  lisinopril-hydrochlorothiazide (ZESTORETIC) 10-12.5 MG tablet Take 1 tablet by mouth daily. 04/23/20   [provider]  oseltamivir (TAMIFLU) 75 MG capsule Take 1 capsule (75 mg total) by mouth 2 (two) times daily. 07/30/20   Carroll Sage, PA-C  venlafaxine XR (EFFEXOR-XR) 75 MG 24 hr capsule Take 75 mg by mouth at bedtime.    [provider]    Allergies    Adhesive [tape]  Review of Systems   Review of Systems  Constitutional:  Positive for chills and fever.  HENT:  Positive for congestion. Negative for sore throat.   Respiratory:  Positive for cough. Negative for shortness of breath.   Cardiovascular:  Negative for chest pain.  Gastrointestinal:  Negative for abdominal pain, diarrhea, nausea and vomiting.  Genitourinary:  Negative for enuresis.  Musculoskeletal:  Negative for myalgias.  Skin:  Negative for rash.  Neurological:  Positive for headaches. Negative for dizziness.  Hematological:  Does not bruise/bleed easily.   Physical Exam Updated Vital Signs BP 122/90 (BP Location: Left Arm)   Pulse 86   Temp 98.8 F (37.1 C)  (Oral)   Resp 18   Ht 5\' 4"  (1.626 m)   Wt 66.2 kg   SpO2 97%   BMI 25.06 kg/m   Physical Exam Vitals and nursing note reviewed.  Constitutional:      General: She is not in acute distress.    Appearance: She is not ill-appearing.  HENT:     Head: Normocephalic and atraumatic.     Nose: No congestion.     Mouth/Throat:     Mouth: Mucous membranes are moist.     Pharynx: Oropharynx is clear. No oropharyngeal exudate or posterior oropharyngeal erythema.  Eyes:     Conjunctiva/sclera: Conjunctivae normal.  Cardiovascular:     Rate and Rhythm: Normal rate and regular rhythm.     Pulses: Normal pulses.     Heart sounds: No murmur heard.   No friction rub. No gallop.  Pulmonary:     Effort: No respiratory distress.     Breath sounds: No wheezing, rhonchi or rales.  Abdominal:     Palpations: Abdomen is soft.     Tenderness: There is no abdominal tenderness.  Musculoskeletal:     Right lower leg: No edema.     Left lower leg: No edema.  Skin:    General: Skin is warm and dry.  Neurological:     Mental Status: She is alert.  Psychiatric:        Mood and Affect: Mood normal.    ED Results / Procedures / Treatments   Labs (all labs ordered are listed, but only abnormal results are displayed) Labs Reviewed  RESP PANEL BY RT-PCR (FLU A&B, COVID) ARPGX2 - Abnormal; Notable for the following components:      Result Value   Influenza A by PCR POSITIVE (*)    All other components within normal limits    EKG None  Radiology No results found.  Procedures Procedures   Medications Ordered in ED Medications - No data to display  ED Course  I have reviewed the triage vital signs and the nursing notes.  Pertinent labs & imaging results that were available during my care of the patient were reviewed by me and considered in my medical decision making (see chart for details).    MDM Rules/Calculators/A&P                         Initial impression-patient presents with  URI-like symptoms.  She is alert, does not appear in acute stress, vital signs reassuring.  Triage obtain respiratory panel.  Work-up-respiratory panel positive for influenza  Rule out- Low suspicion for systemic infection as patient is nontoxic-appearing, vital signs reassuring, no obvious source infection noted on exam.  Low suspicion for pneumonia as lung sounds are clear bilaterally, atypical to develop pneumonia in 1 days time, will defer imaging at this time.  I have low suspicion for PE as patient denies pleuritic chest pain,  shortness of breath, vital signs reassuring. low suspicion for strep throat as oropharynx was visualized, no erythema or exudates noted.  Low suspicion patient would need  hospitalized due to viral infection or Covid as vital signs reassuring, patient is not in respiratory distress.    Plan-  URI-like symptoms-suspect secondary due to influenza.  Due to her age and sudden onset of symptoms will start her on Tamiflu.   will also provide patient with antiemetics.  Have her follow-up with PCP as needed.  Vital signs have remained stable, no indication for hospital admission.  Patient discussed with attending and they agreed with assessment and plan.  Patient given at home care as well strict return precautions.  Patient verbalized that they understood agreed to said plan.  Final Clinical Impression(s) / ED Diagnoses Final diagnoses:  None    Rx / DC Orders ED Discharge Orders          Ordered    oseltamivir (TAMIFLU) 75 MG capsule  2 times daily,   Status:  Discontinued        07/30/20 1746    oseltamivir (TAMIFLU) 75 MG capsule  2 times daily        07/30/20 1747    ondansetron (ZOFRAN) 4 MG tablet  Every 6 hours        07/30/20 1747             Carroll Sage, PA-C 07/30/20 1946    Melene Plan, DO 07/30/20 2342

## 2020-07-30 NOTE — ED Triage Notes (Signed)
Pt c/o flu like sx started yesterday with +flu exposure-NAD-steady gait

## 2020-07-30 NOTE — Discharge Instructions (Addendum)
You have a positive flu test.  Starting you on antibiotics please take as prescribed. also given you Zofran please use as needed for nausea.  Please stay hydrated, I recommend Tylenol for fever control ibuprofen for pain control.  Please follow-up your PCP as needed.  Come back to the emergency department if you develop chest pain, shortness of breath, severe abdominal pain, uncontrolled nausea, vomiting, diarrhea.

## 2020-08-11 IMAGING — CR DG SHOULDER 2+V*R*
4 series · 4 of 4 positions shown · non-contrast
Comparison: None.

CLINICAL DATA: Fell, limited range of motion, right shoulder pain

EXAM:
RIGHT SHOULDER - 2+ VIEW

[w shoulder grashey right]
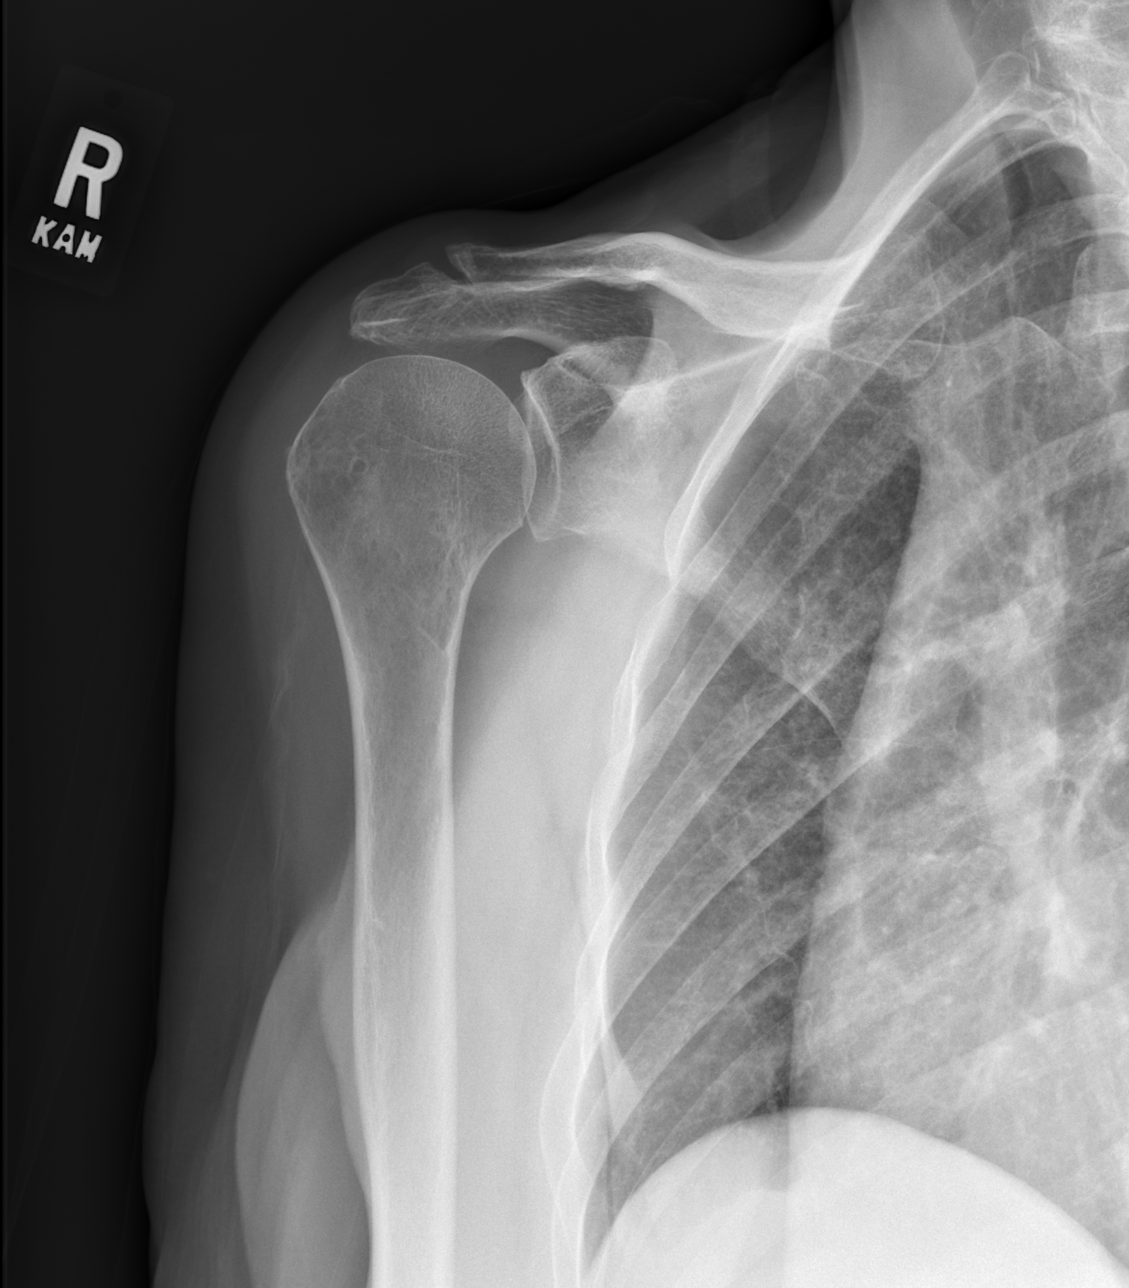

[w shoulder y-view right]
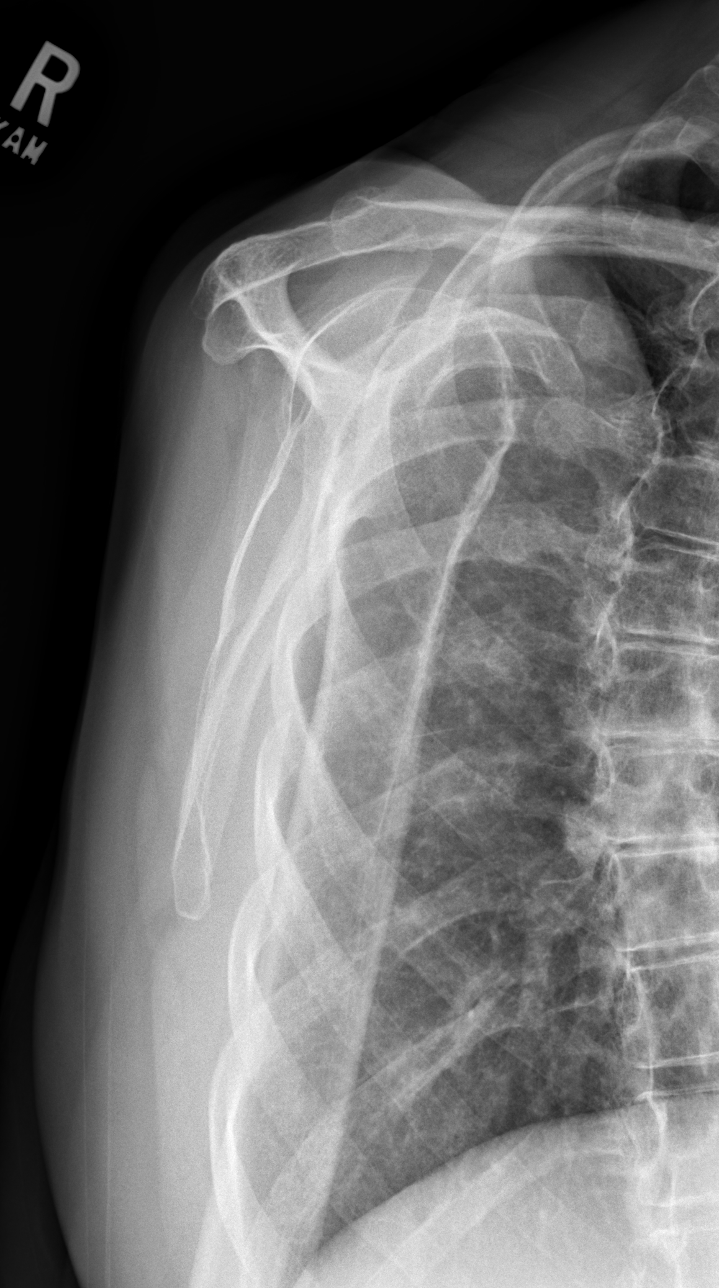

[w shoulder axillary right (1 of 2)]
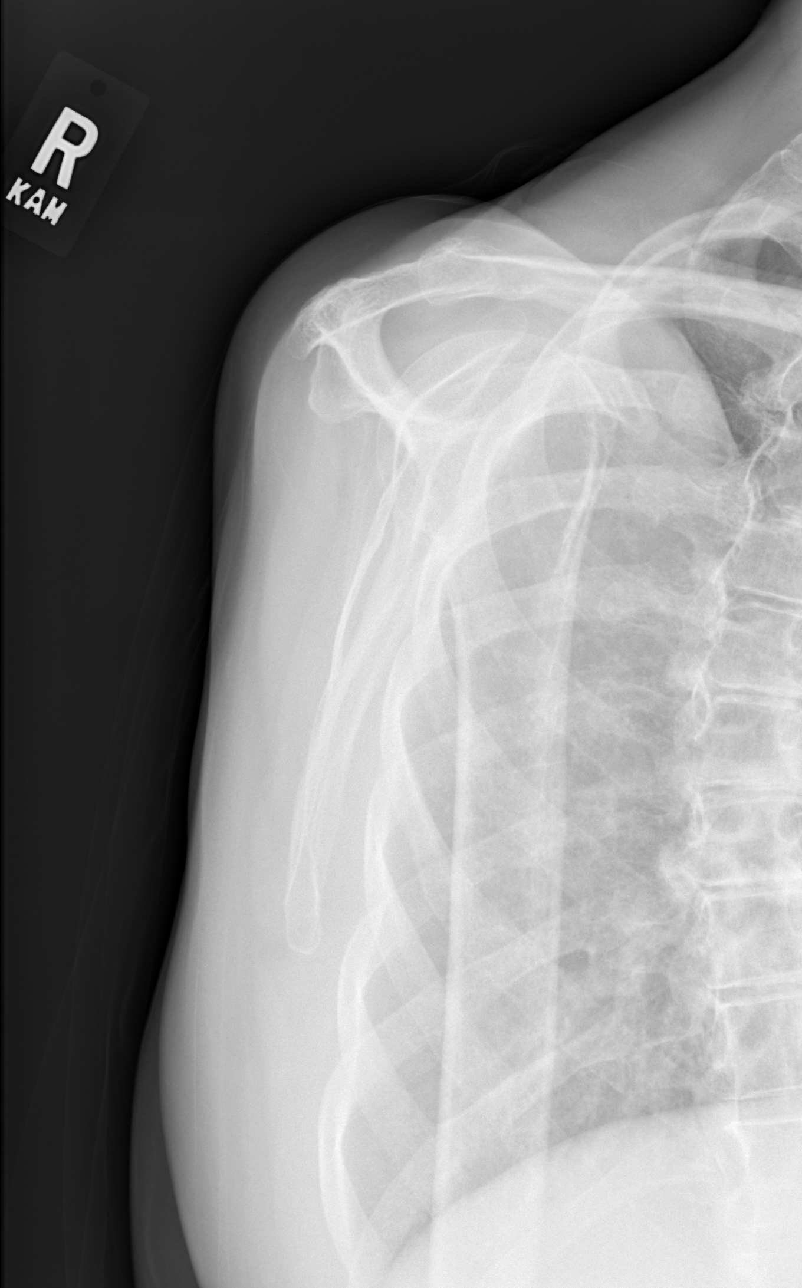

[w shoulder axillary right (2 of 2)]
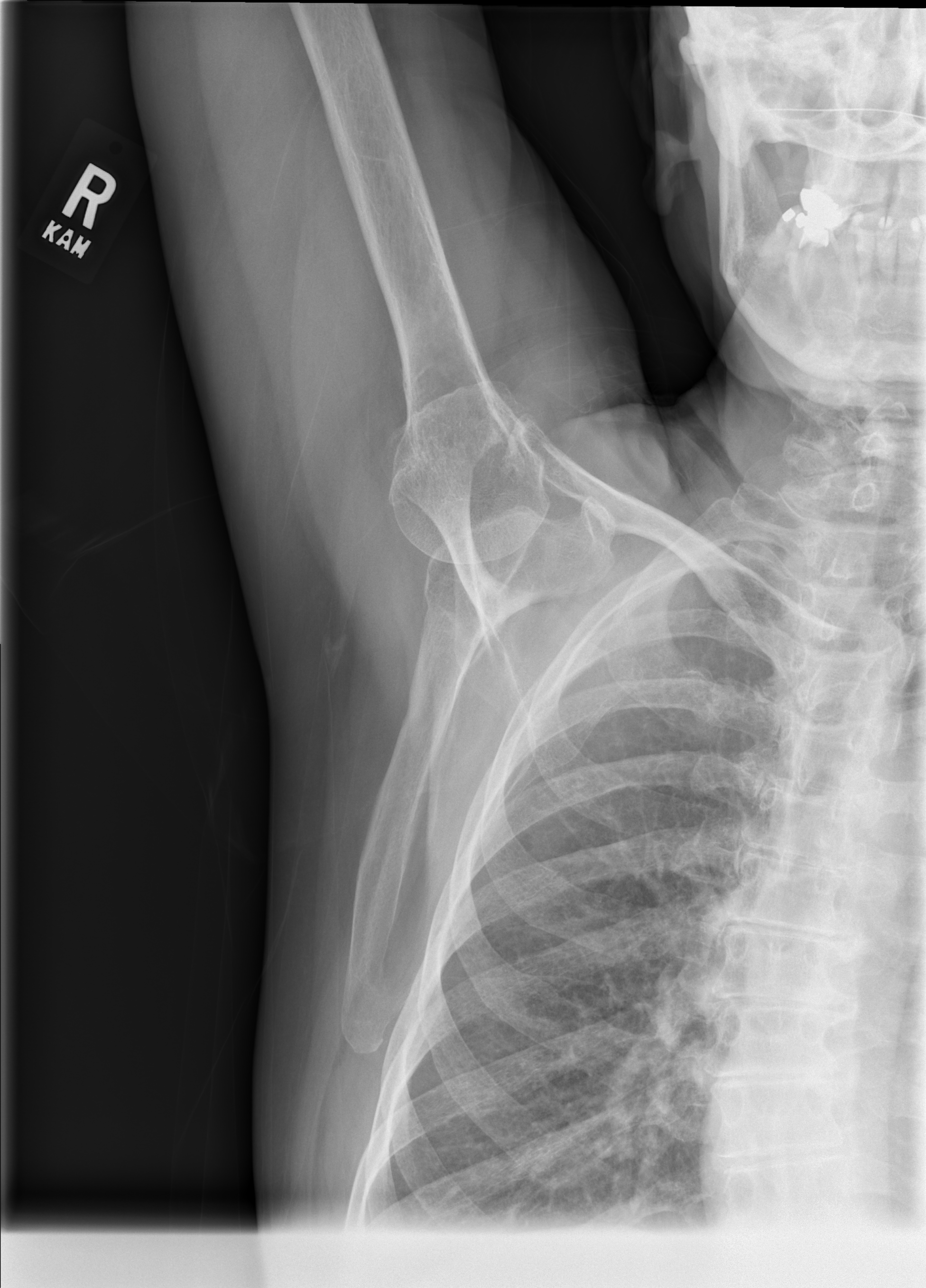

[4 of 4 positions shown; findings below may reference images not displayed]

FINDINGS: Frontal, transscapular, and axillary views of the right shoulder
demonstrate no fracture, subluxation, or dislocation. Mild
osteoarthritis of the acromioclavicular and glenohumeral joints.
Right chest is clear.
IMPRESSION: 1. Mild osteoarthritis.  No acute bony abnormality.

## 2021-07-21 ENCOUNTER — Other Ambulatory Visit: Payer: Self-pay | Admitting: Internal Medicine

## 2021-07-21 DIAGNOSIS — I10 Essential (primary) hypertension: Secondary | ICD-10-CM

## 2021-07-21 DIAGNOSIS — E785 Hyperlipidemia, unspecified: Secondary | ICD-10-CM

## 2021-09-16 ENCOUNTER — Ambulatory Visit
Admission: RE | Admit: 2021-09-16 | Discharge: 2021-09-16 | Disposition: A | Discharge status: Home / Self Care | Payer: No Typology Code available for payment source | Source: Ambulatory Visit | Attending: Internal Medicine | Admitting: Internal Medicine

## 2021-09-16 DIAGNOSIS — I10 Essential (primary) hypertension: Secondary | ICD-10-CM

## 2021-09-16 DIAGNOSIS — E785 Hyperlipidemia, unspecified: Secondary | ICD-10-CM

## 2021-11-16 ENCOUNTER — Other Ambulatory Visit: Payer: Self-pay | Admitting: Orthopaedic Surgery

## 2021-11-16 DIAGNOSIS — M67911 Unspecified disorder of synovium and tendon, right shoulder: Secondary | ICD-10-CM

## 2021-12-13 ENCOUNTER — Other Ambulatory Visit: Payer: Self-pay

## 2021-12-14 ENCOUNTER — Ambulatory Visit
Admission: RE | Admit: 2021-12-14 | Discharge: 2021-12-14 | Disposition: A | Discharge status: Home / Self Care | Payer: Medicare HMO | Source: Ambulatory Visit | Attending: Orthopaedic Surgery | Admitting: Orthopaedic Surgery

## 2021-12-14 DIAGNOSIS — M67911 Unspecified disorder of synovium and tendon, right shoulder: Secondary | ICD-10-CM

## 2022-01-03 ENCOUNTER — Other Ambulatory Visit: Payer: Self-pay | Admitting: Family Medicine

## 2022-01-03 DIAGNOSIS — R109 Unspecified abdominal pain: Secondary | ICD-10-CM

## 2022-01-16 ENCOUNTER — Ambulatory Visit
Admission: RE | Admit: 2022-01-16 | Discharge: 2022-01-16 | Disposition: A | Discharge status: Home / Self Care | Payer: Medicare HMO | Source: Ambulatory Visit | Attending: Family Medicine | Admitting: Family Medicine

## 2022-01-16 DIAGNOSIS — R109 Unspecified abdominal pain: Secondary | ICD-10-CM

## 2022-02-01 ENCOUNTER — Other Ambulatory Visit: Payer: Self-pay | Admitting: Family Medicine

## 2022-02-01 ENCOUNTER — Ambulatory Visit
Admission: RE | Admit: 2022-02-01 | Discharge: 2022-02-01 | Disposition: A | Discharge status: Home / Self Care | Payer: Medicare HMO | Source: Ambulatory Visit | Attending: Family Medicine | Admitting: Family Medicine

## 2022-02-01 DIAGNOSIS — J209 Acute bronchitis, unspecified: Secondary | ICD-10-CM

## 2022-02-09 ENCOUNTER — Encounter: Payer: Self-pay | Admitting: Physician Assistant

## 2022-02-20 NOTE — Progress Notes (Signed)
02/24/2022 Karen Horne [id number] 03/15/1950  Referring provider: Loura Pardon, MD Primary GI doctor: Dr. Lyndel Safe ( Dr. Sharlett Iles)  ASSESSMENT AND PLAN:   Diarrhea with rectal bleeding, bloating I do not see labs from PCP, possible positive FIT or cologuard, with symptoms and rectal bleeding and patient is healthy will proceed with colonoscopy Will check for anemia, reasons for chronic diarrhea, possible pelvic floor versus IBS We have discussed the risks of bleeding, infection, perforation, medication reactions, and remote risk of death associated with colonoscopy. All questions were answered and the patient acknowledges these risk and wishes to proceed.  RUQ pain with bloating With medication change to pravastatin, better being off of it RUQ US unremarkable If returns consider HIDA   Patient Care Team: Buzzy Han, MD (Inactive) as PCP - General (Family Medicine)  HISTORY OF PRESENT ILLNESS: 72 y.o. female with a past medical history of hypertension, IBS, hemorrhoids, GERD with history of hiatal hernia and previous fundoplication surgery, fatty liver disease, hypothyroidism, B12 deficiency and others listed below presents for evaluation of RUQ pain and hemorrhoids.   Previously Dr. Sharlett Iles patient.  10/30/2007 endoscopy for abdominal pain showed intact fundoplication normal proximal distal esophagus, normal stomach and duodenum. Pathology showed benign small bowel mucosa no celiac 10/30/2007 Colonoscopy for screening showed 5 mm hyperplastic polyp, recall 10 years. No recent labs to review but states had with PCP Dr. Dorian Pod Patient states she had positive cologuard with her PCP in Dec.   She went to [place], [place] and [place], while in [place] she started to have diarrhea.  Went in July and came back August 1st continued a month after that.  While she was there had watery stools after eating, up to 3 x a day, better with not eating or imodium, no  nocturnal symptoms, no associated symptoms such as nausea, vomiting, fever or chills. This improved but she will have loose stools/diarrhea with fatty food or fried foods.  She has rectal pressure, no rectal pain, itching, has some BRB on TP.  Denies weight loss, had had about 8 lbs weight gain.  She had been taking atorvastatin for a long time, having muscle pain, she was switched to pravastatin end of the years and started to have bloating and RUQ pain.  This has resoveld with stopping the pravastatin.  01/16/2022 RUQ US hepatic steatosis, GB normal, CBD normal.  She denies GERD, dysphagia, melena, nausea, vomiting.    She  reports that she has never smoked. She has never used smokeless tobacco. She reports that she does not drink alcohol and does not use drugs.  Current Medications:   Current Outpatient Medications (Endocrine & Metabolic):    levothyroxine (SYNTHROID, LEVOTHROID) 75 MCG tablet, Take 75 mcg by mouth daily before breakfast.  Current Outpatient Medications (Cardiovascular):    lisinopril-hydrochlorothiazide (ZESTORETIC) 10-12.5 MG tablet, Take 1 tablet by mouth daily.     Current Outpatient Medications (Other):    Sod Picosulfate-Mag Ox-Cit Acd (CLENPIQ) 10-3.5-12 MG-GM -GM/160ML SOLN, Take 1 kit by mouth as directed.   venlafaxine XR (EFFEXOR-XR) 75 MG 24 hr capsule, Take 75 mg by mouth at bedtime.   ondansetron (ZOFRAN) 4 MG tablet, Take 1 tablet (4 mg total) by mouth every 6 (six) hours. (Patient not taking: Reported on 02/24/2022)   oseltamivir (TAMIFLU) 75 MG capsule, Take 1 capsule (75 mg total) by mouth 2 (two) times daily. 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 Family History:  Her family history includes High blood pressure in her father, maternal grandfather, maternal grandmother, mother, paternal grandfather, and paternal grandmother.  REVIEW OF SYSTEMS  : All other systems reviewed and negative except where noted in the History of Present Illness.  PHYSICAL EXAM: BP 120/68   Pulse 73   Ht 5\' 4"  (1.626 m)   Wt 150 lb (68 kg)   SpO2 97%   BMI 25.75 kg/m  General:   Pleasant, well developed female in no acute distress Head:   Normocephalic and atraumatic. Eyes:  sclerae anicteric,conjunctive pink  Heart:   regular rate and rhythm Pulm:  Clear anteriorly; no wheezing Abdomen:   Soft, Non-distended AB, Active bowel sounds. No tenderness ., No organomegaly appreciated. Rectal: Normal external rectal exam, normal rectal tone, no internal hemorrhoids appreciated, no masses, non tender, brown stool, hemoccult Negative Extremities:  Without edema. Msk: Symmetrical without gross deformities. Peripheral pulses intact.  Neurologic:  Alert and  oriented x4;  No focal deficits.  Skin:   Dry and intact without significant lesions or rashes. Psychiatric:  Cooperative. Normal mood and affect.  RELEVANT LABS AND IMAGING: CBC    Component Value Date/Time   WBC 7.1 02/26/2017 0524   RBC 3.90 02/26/2017 0524   HGB 11.9 (L) 02/26/2017 0524   HCT 37.0 02/26/2017 0524   PLT 183 02/26/2017 0524   MCV 94.9 02/26/2017 0524   MCH 30.5 02/26/2017 0524   MCHC 32.2 02/26/2017 0524   RDW 13.8 02/26/2017 0524   LYMPHSABS 3.4 02/25/2017 2124   MONOABS 0.4 02/25/2017 2124   EOSABS 0.1 02/25/2017 2124   BASOSABS 0.0 02/25/2017 2124    CMP     Component Value Date/Time   NA 137 02/26/2017 0524    K 3.7 02/26/2017 0524   CL 105 02/26/2017 0524   CO2 20 (L) 02/26/2017 0524   GLUCOSE 102 (H) 02/26/2017 0524   BUN 26 (H) 02/26/2017 0524   CREATININE 1.08 (H) 02/26/2017 0524   CALCIUM 8.9 02/26/2017 0524   PROT 6.2 (L) 02/25/2017 2124   ALBUMIN 3.8 02/25/2017 2124   AST 27 02/25/2017 2124   ALT 11 (L) 02/25/2017 2124   ALKPHOS 70 02/25/2017 2124   BILITOT 1.2 02/25/2017 2124   GFRNONAA 52 (L) 02/26/2017 0524   GFRAA >60 02/26/2017 0524     Vladimir Crofts, PA-C 10:22 AM

## 2022-02-24 ENCOUNTER — Encounter: Payer: Self-pay | Admitting: Physician Assistant

## 2022-02-24 ENCOUNTER — Ambulatory Visit: Payer: Medicare HMO | Admitting: Physician Assistant

## 2022-02-24 ENCOUNTER — Other Ambulatory Visit (INDEPENDENT_AMBULATORY_CARE_PROVIDER_SITE_OTHER): Payer: Medicare HMO

## 2022-02-24 VITALS — BP 120/68 | HR 73 | Ht 64.0 in | Wt 150.0 lb

## 2022-02-24 DIAGNOSIS — K625 Hemorrhage of anus and rectum: Secondary | ICD-10-CM | POA: Diagnosis not present

## 2022-02-24 DIAGNOSIS — R140 Abdominal distension (gaseous): Secondary | ICD-10-CM | POA: Diagnosis not present

## 2022-02-24 DIAGNOSIS — R197 Diarrhea, unspecified: Secondary | ICD-10-CM | POA: Diagnosis not present

## 2022-02-24 DIAGNOSIS — R1011 Right upper quadrant pain: Secondary | ICD-10-CM

## 2022-02-24 LAB — COMPREHENSIVE METABOLIC PANEL
ALT: 11 U/L (ref 0–35)
AST: 18 U/L (ref 0–37)
Albumin: 4.1 g/dL (ref 3.5–5.2)
Alkaline Phosphatase: 82 U/L (ref 39–117)
BUN: 17 mg/dL (ref 6–23)
CO2: 28 mEq/L (ref 19–32)
Calcium: 9.3 mg/dL (ref 8.4–10.5)
Chloride: 100 mEq/L (ref 96–112)
Creatinine, Ser: 0.95 mg/dL (ref 0.40–1.20)
GFR: 59.82 mL/min — ABNORMAL LOW (ref >60.00)
Glucose, Bld: 92 mg/dL (ref 70–99)
Potassium: 4 mEq/L (ref 3.5–5.1)
Sodium: 136 mEq/L (ref 135–145)
Total Bilirubin: 0.4 mg/dL (ref 0.2–1.2)
Total Protein: 6.8 g/dL (ref 6.0–8.3)

## 2022-02-24 LAB — CBC WITH DIFFERENTIAL/PLATELET
Basophils Absolute: 0 10*3/uL (ref 0.0–0.1)
Basophils Relative: 0.6 % (ref 0.0–3.0)
Eosinophils Absolute: 0.2 10*3/uL (ref 0.0–0.7)
Eosinophils Relative: 2.8 % (ref 0.0–5.0)
HCT: 39.9 % (ref 36.0–46.0)
Hemoglobin: 13.4 g/dL (ref 12.0–15.0)
Lymphocytes Relative: 23.2 % (ref 12.0–46.0)
Lymphs Abs: 1.4 10*3/uL (ref 0.7–4.0)
MCHC: 33.6 g/dL (ref 30.0–36.0)
MCV: 91.2 fl (ref 78.0–100.0)
Monocytes Absolute: 0.4 10*3/uL (ref 0.1–1.0)
Monocytes Relative: 7.5 % (ref 3.0–12.0)
Neutro Abs: 3.9 10*3/uL (ref 1.4–7.7)
Neutrophils Relative %: 65.9 % (ref 43.0–77.0)
Platelets: 214 10*3/uL (ref 150.0–400.0)
RBC: 4.38 Mil/uL (ref 3.87–5.11)
RDW: 14.6 % (ref 11.5–15.5)
WBC: 5.9 10*3/uL (ref 4.0–10.5)

## 2022-02-24 LAB — IBC + FERRITIN
Ferritin: 44.2 ng/mL (ref 10.0–291.0)
Iron: 61 ug/dL (ref 42–145)
Saturation Ratios: 19.1 % — ABNORMAL LOW (ref 20.0–50.0)
TIBC: 319.2 ug/dL (ref 250.0–450.0)
Transferrin: 228 mg/dL (ref 212.0–360.0)

## 2022-02-24 LAB — TSH: TSH: 7.36 u[IU]/mL — ABNORMAL HIGH (ref 0.35–5.50)

## 2022-02-24 MED ORDER — CLENPIQ 10-3.5-12 MG-GM -GM/160ML PO SOLN: 1.0000 | ORAL | 0 refills | Status: DC

## 2022-02-24 NOTE — Patient Instructions (Addendum)
You have been scheduled for a colonoscopy. Please follow written instructions given to you at your visit today.  Please pick up your prep supplies at the pharmacy within the next 1-3 days. If you use inhalers (even only as needed), please bring them with you on the day of your procedure.  Your provider has requested that you go to the basement level for lab work before leaving today. Press "B" on the elevator. The lab is located at the first door on the left as you exit the elevator.  First do a trial off milk/lactose products if you use them.  Add fiber like benefiber or citracel once a day Increase activity Can do trial of IBGard which is over the counter for AB pain- Take 1-2 capsules once a day for maintence or twice a day during a flare  Please try low FODMAP diet- see below- start with eliminating just one column at a time, the table at the very bottom contains foods that are safe to take   FODMAP stands for fermentable oligo-, di-, mono-saccharides and polyols (1). These are the scientific terms used to classify groups of carbs that are notorious for triggering digestive symptoms like bloating, gas and stomach pain.     Can do trial of pepcid Avoid spicy and acidic foods Avoid fatty foods Limit your intake of coffee, tea, alcohol, and carbonated drinks Work to maintain a healthy weight Keep the head of the bed elevated at least 3 inches with blocks or a wedge pillow if you are having any nighttime symptoms Stay upright for 2 hours after eating Avoid meals and snacks three to four hours before bedtime

## 2022-02-25 LAB — TISSUE TRANSGLUTAMINASE, IGA: (tTG) Ab, IgA: <1 U/mL

## 2022-02-25 LAB — IGA: Immunoglobulin A: 238 mg/dL (ref 70–320)

## 2022-03-04 NOTE — Progress Notes (Signed)
Agree with assessment/plan.  Raj Milli Woolridge, MD [facility] [phone]  

## 2022-04-18 ENCOUNTER — Encounter: Payer: Self-pay | Admitting: Gastroenterology

## 2022-04-23 ENCOUNTER — Encounter: Payer: Self-pay | Admitting: Certified Registered Nurse Anesthetist

## 2022-04-26 ENCOUNTER — Encounter: Payer: Self-pay | Admitting: Gastroenterology

## 2022-04-26 ENCOUNTER — Ambulatory Visit (AMBULATORY_SURGERY_CENTER): Payer: Medicare HMO | Admitting: Gastroenterology

## 2022-04-26 VITALS — BP 120/70 | HR 82 | Temp 96.2°F | Resp 15 | Ht 64.0 in | Wt 150.0 lb

## 2022-04-26 DIAGNOSIS — R197 Diarrhea, unspecified: Secondary | ICD-10-CM

## 2022-04-26 MED ORDER — DICYCLOMINE HCL 10 MG PO CAPS: 10.0000 mg | ORAL_CAPSULE | Freq: Two times a day (BID) | ORAL | 0 refills | Status: DC | PRN

## 2022-04-26 MED ORDER — SODIUM CHLORIDE 0.9 % IV SOLN
4.0000 mg | Freq: Once | INTRAVENOUS | Status: AC
  Administered 2022-04-26: 4 mg via INTRAVENOUS

## 2022-04-26 MED ORDER — SODIUM CHLORIDE 0.9 % IV SOLN: 500.0000 mL | Freq: Once | INTRAVENOUS | Status: DC

## 2022-04-26 NOTE — Patient Instructions (Signed)
Prescription for Bentyl sent to [facility] in [place]:   Refer to the procedure report that was given to you for any specific questions about what was found during the examination.  If the procedure report does not answer your questions, please call your gastroenterologist to clarify.  If you requested that your care partner not be given the details of your procedure findings, then the procedure report has been included in a sealed envelope for you to review at your convenience later.  YOU SHOULD EXPECT: Some feelings of bloating in the abdomen. Passage of more gas than usual.  Walking can help get rid of the air that was put into your GI tract during the procedure and reduce the bloating. If you had a lower endoscopy (such as a colonoscopy or flexible sigmoidoscopy) you may notice spotting of blood in your stool or on the toilet paper. If you underwent a bowel prep for your procedure, you may not have a normal bowel movement for a few days.  Please Note:  You might notice some irritation and congestion in your nose or some drainage.  This is from the oxygen used during your procedure.  There is no need for concern and it should clear up in a day or so.  SYMPTOMS TO REPORT IMMEDIATELY:  Following lower endoscopy (colonoscopy or flexible sigmoidoscopy):  Excessive amounts of blood in the stool  Significant tenderness or worsening of abdominal pains  Swelling of the abdomen that is new, acute  Fever of 100F or higher   For urgent or emergent issues, a gastroenterologist can be reached at any hour by calling [phone]. Do not use MyChart messaging for urgent concerns.    DIET:  We do recommend a small meal at first, but then you may proceed to your regular diet.  Drink plenty of fluids but you should avoid alcoholic beverages for 24 hours.  ACTIVITY:  You should plan to take it easy for the rest of today and you should NOT  DRIVE or use heavy machinery until tomorrow (because of the sedation medicines used during the test).    FOLLOW UP: Our staff will call the number listed on your records the next business day following your procedure.  We will call around 7:15- 8:00 am to check on you and address any questions or concerns that you may have regarding the information given to you following your procedure. If we do not reach you, we will leave a message.     If any biopsies were taken you will be contacted by phone or by letter within the next 1-3 weeks.  Please call us at [phone] if you have not heard about the biopsies in 3 weeks.    SIGNATURES/CONFIDENTIALITY: You and/or your care partner have signed paperwork which will be entered into your electronic medical record.  These signatures attest to the fact that that the information above on your After Visit Summary has been reviewed and is understood.  Full responsibility of the confidentiality of this discharge information lies with you and/or your care-partner.

## 2022-04-26 NOTE — Progress Notes (Signed)
Called to room to assist during endoscopic procedure.  Patient ID and intended procedure confirmed with present staff. Received instructions for my participation in the procedure from the performing physician.  

## 2022-04-26 NOTE — Progress Notes (Signed)
02/24/2022 Karen Horne [id number] [birth year]   Referring provider: Loura Pardon, MD Primary GI doctor: Dr. Lyndel Safe ( Dr. Sharlett Iles)   ASSESSMENT AND PLAN:    Diarrhea with rectal bleeding, bloating I do not see labs from PCP, possible positive FIT or cologuard, with symptoms and rectal bleeding and patient is healthy will proceed with colonoscopy Will check for anemia, reasons for chronic diarrhea, possible pelvic floor versus IBS We have discussed the risks of bleeding, infection, perforation, medication reactions, and remote risk of death associated with colonoscopy. All questions were answered and the patient acknowledges these risk and wishes to proceed.   RUQ pain with bloating With medication change to pravastatin, better being off of it RUQ US unremarkable If returns consider HIDA     Patient Care Team: Buzzy Han, MD (Inactive) as PCP - General (Family Medicine)   HISTORY OF PRESENT ILLNESS: 72 y.o. female with a past medical history of hypertension, IBS, hemorrhoids, GERD with history of hiatal hernia and previous fundoplication surgery, fatty liver disease, hypothyroidism, B12 deficiency and others listed below presents for evaluation of RUQ pain and hemorrhoids.    Previously Dr. Sharlett Iles patient.  10/30/2007 endoscopy for abdominal pain showed intact fundoplication normal proximal distal esophagus, normal stomach and duodenum. Pathology showed benign small bowel mucosa no celiac 10/30/2007 Colonoscopy for screening showed 5 mm hyperplastic polyp, recall 10 years. No recent labs to review but states had with PCP Dr. Dorian Pod Patient states she had positive cologuard with her PCP in Dec.    She went to [place], [place] and [place], while in [place] she started to have diarrhea.  Went in July and came back August 1st continued a month after that.  While she was there had watery stools after eating, up to 3 x a day, better with not eating or imodium,  no nocturnal symptoms, no associated symptoms such as nausea, vomiting, fever or chills. This improved but she will have loose stools/diarrhea with fatty food or fried foods.  She has rectal pressure, no rectal pain, itching, has some BRB on TP.  Denies weight loss, had had about 8 lbs weight gain.  She had been taking atorvastatin for a long time, having muscle pain, she was switched to pravastatin end of the years and started to have bloating and RUQ pain.  This has resoveld with stopping the pravastatin.  01/16/2022 RUQ US hepatic steatosis, GB normal, CBD normal.  She denies GERD, dysphagia, melena, nausea, vomiting.      She  reports that she has never smoked. She has never used smokeless tobacco. She reports that she does not drink alcohol and does not use drugs.   Current Medications:    Current Outpatient Medications (Endocrine & Metabolic):    levothyroxine (SYNTHROID, LEVOTHROID) 75 MCG tablet, Take 75 mcg by mouth daily before breakfast.   Current Outpatient Medications (Cardiovascular):    lisinopril-hydrochlorothiazide (ZESTORETIC) 10-12.5 MG tablet, Take 1 tablet by mouth daily.         Current Outpatient Medications (Other):    Sod Picosulfate-Mag Ox-Cit Acd (CLENPIQ) 10-3.5-12 MG-GM -GM/160ML SOLN, Take 1 kit by mouth as directed.   venlafaxine XR (EFFEXOR-XR) 75 MG 24 hr capsule, Take 75 mg by mouth at bedtime.   ondansetron (ZOFRAN) 4 MG tablet, Take 1 tablet (4 mg total) by mouth every 6 (six) hours. (Patient not taking: Reported on 02/24/2022)   oseltamivir (TAMIFLU) 75 MG capsule, Take 1 capsule (75 mg total) by mouth 2 (two) times daily. (Patient not  taking: Reported on 02/24/2022)   Medical History:      Past Medical History:  Diagnosis Date   Arthritis     Chronic kidney disease pyelonephritis   Depression     Fatty liver     GERD (gastroesophageal reflux disease)      surgery for acid reflux 10 yrs ago   Hemorrhoids     Hypercholesterolemia      Hypertension     Hypothyroidism     IBS (irritable bowel syndrome)      Allergies:      Allergies  Allergen Reactions   Adhesive [Tape] Rash      Surgical History:  She  has a past surgical history that includes arthroscopic knee (Left); reflux surgery; and Total knee arthroplasty (Left, 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 Family History:  Her family history includes High blood pressure in her father, maternal grandfather, maternal grandmother, mother, paternal grandfather, and paternal grandmother.   REVIEW OF SYSTEMS  : All other systems reviewed and negative except where noted in the History of Present Illness.   PHYSICAL EXAM: BP 120/68   Pulse 73   Ht 5\' 4"  (1.626 m)   Wt 150 lb (68 kg)   SpO2 97%   BMI 25.75 kg/m  General:   Pleasant, well developed female in no acute distress Head:   Normocephalic and atraumatic. Eyes:  sclerae anicteric,conjunctive pink  Heart:   regular rate and rhythm Pulm:  Clear anteriorly; no wheezing Abdomen:   Soft, Non-distended AB, Active bowel sounds. No tenderness ., No organomegaly appreciated. Rectal: Normal external rectal exam, normal rectal tone, no internal hemorrhoids appreciated, no masses, non tender, brown stool, hemoccult Negative Extremities:  Without edema. Msk: Symmetrical without gross deformities. Peripheral pulses intact.  Neurologic:  Alert and  oriented x4;  No focal deficits.  Skin:   Dry and intact without significant lesions or rashes. Psychiatric:  Cooperative. Normal mood and affect.   RELEVANT LABS AND IMAGING: CBC Labs (Brief)          Component Value Date/Time    WBC 7.1 02/26/2017 0524    RBC 3.90 02/26/2017 0524    HGB 11.9 (L) 02/26/2017 0524    HCT 37.0 02/26/2017 0524    PLT 183 02/26/2017 0524    MCV 94.9 02/26/2017 0524    MCH 30.5 02/26/2017 0524    MCHC 32.2 02/26/2017 0524    RDW 13.8 02/26/2017 0524    LYMPHSABS 3.4 02/25/2017 2124    MONOABS 0.4 02/25/2017 2124    EOSABS 0.1 02/25/2017 2124    BASOSABS 0.0  02/25/2017 2124        CMP     Labs (Brief)          Component Value Date/Time    NA 137 02/26/2017 0524    K 3.7 02/26/2017 0524    CL 105 02/26/2017 0524    CO2 20 (L) 02/26/2017 0524    GLUCOSE 102 (H) 02/26/2017 0524    BUN 26 (H) 02/26/2017 0524    CREATININE 1.08 (H) 02/26/2017 0524    CALCIUM 8.9 02/26/2017 0524    PROT 6.2 (L) 02/25/2017 2124    ALBUMIN 3.8 02/25/2017 2124    AST 27 02/25/2017 2124    ALT 11 (L) 02/25/2017 2124    ALKPHOS 70 02/25/2017 2124    BILITOT 1.2 02/25/2017 2124    GFRNONAA 52 (L) 02/26/2017 0524    GFRAA >60 02/26/2017 Summers Collier, PA-C  Attending physician's note   I have taken history, reviewed the chart and examined the patient. I performed a substantive portion of this encounter, including complete performance of at least one of the key components, in conjunction with the APP. I agree with the Advanced Practitioner's note, impression and recommendations.   For colon today   Carmell Austria, MD [facility] [phone]

## 2022-04-26 NOTE — Op Note (Signed)
[facility] Patient Name: Karen Horne Procedure Date: 04/26/2022 10:32 AM MRN: [id number] Endoscopist: Jackquline Denmark , MD, [id number] Age: 72 Referring MD:  Date of Birth: 10/09/1950 Gender: Female Account #: [id number] Procedure:                Colonoscopy Indications:              Clinically significant diarrhea of unexplained                            origin-after travel to [place]. Positive Cologuard. Medicines:                Monitored Anesthesia Care Procedure:                Pre-Anesthesia Assessment:                           - Prior to the procedure, a History and Physical                            was performed, and patient medications and                            allergies were reviewed. The patient's tolerance of                            previous anesthesia was also reviewed. The risks                            and benefits of the procedure and the sedation                            options and risks were discussed with the patient.                            All questions were answered, and informed consent                            was obtained. Prior Anticoagulants: The patient has                            taken no anticoagulant or antiplatelet agents. ASA                            Grade Assessment: II - A patient with mild systemic                            disease. After reviewing the risks and benefits,                            the patient was deemed in satisfactory condition to                            undergo the procedure.  After obtaining informed consent, the colonoscope                            was passed under direct vision. Throughout the                            procedure, the patient's blood pressure, pulse, and                            oxygen saturations were monitored continuously. The                            Olympus [id number]) Colonoscope was                            introduced through  the anus and advanced to the 2                            cm into the ileum. The colonoscopy was performed                            without difficulty. The patient tolerated the                            procedure well. The quality of the bowel                            preparation was good. The terminal ileum, ileocecal                            valve, appendiceal orifice, and rectum were                            photographed. Scope In: 10:48:47 AM Scope Out: 11:03:30 AM Scope Withdrawal Time: 0 hours 10 minutes 13 seconds  Total Procedure Duration: 0 hours 14 minutes 43 seconds  Findings:                 The colon (entire examined portion) appeared                            normal. Biopsies for histology were taken with a                            cold forceps from the entire colon for evaluation                            of microscopic colitis.                           A few medium-mouthed diverticula were found in the                            sigmoid colon.  Non-bleeding internal hemorrhoids were found during                            retroflexion. The hemorrhoids were moderate and                            Grade I (internal hemorrhoids that do not prolapse).                           The terminal ileum appeared normal.                           The exam was otherwise without abnormality on                            direct and retroflexion views. Complications:            No immediate complications. Estimated Blood Loss:     Estimated blood loss: none. Impression:               - Mild sigmoid diverticulosis                           - Otherwise normal colonoscopy to TI. Recommendation:           - Patient has a contact number available for                            emergencies. The signs and symptoms of potential                            delayed complications were discussed with the                            patient. Return to normal  activities tomorrow.                            Written discharge instructions were provided to the                            patient.                           - Resume previous diet.                           - Continue present medications.                           - Await pathology results.                           - Trial of Bentyl 10 mg p.o. twice daily x 4 weeks,                            2RF                           -  The findings and recommendations were discussed                            with the patient's family. Jackquline Denmark, MD 04/26/2022 11:08:39 AM This report has been signed electronically.

## 2022-04-27 ENCOUNTER — Telehealth: Payer: Self-pay

## 2022-04-27 NOTE — Telephone Encounter (Signed)
PA request received via CMM for Dicyclomine HCl 10MG  capsules  PA has been submitted to [facility] and has been APPROVED from 04/27/2022-01/30/2023  Key: [id number]

## 2022-04-27 NOTE — Telephone Encounter (Signed)
Follow up call to pt, no answer.  

## 2022-04-30 ENCOUNTER — Encounter: Payer: Self-pay | Admitting: Gastroenterology

## 2022-05-13 ENCOUNTER — Encounter (HOSPITAL_BASED_OUTPATIENT_CLINIC_OR_DEPARTMENT_OTHER): Payer: Self-pay

## 2022-05-13 ENCOUNTER — Emergency Department (HOSPITAL_BASED_OUTPATIENT_CLINIC_OR_DEPARTMENT_OTHER): Payer: Medicare HMO

## 2022-05-13 ENCOUNTER — Emergency Department (HOSPITAL_BASED_OUTPATIENT_CLINIC_OR_DEPARTMENT_OTHER)
Admission: EM | Admit: 2022-05-13 | Discharge: 2022-05-13 | Disposition: A | Discharge status: Home / Self Care | Payer: Medicare HMO | Attending: Emergency Medicine | Admitting: Emergency Medicine

## 2022-05-13 ENCOUNTER — Emergency Department (HOSPITAL_BASED_OUTPATIENT_CLINIC_OR_DEPARTMENT_OTHER): Payer: Medicare HMO | Admitting: Radiology

## 2022-05-13 ENCOUNTER — Other Ambulatory Visit: Payer: Self-pay

## 2022-05-13 DIAGNOSIS — E039 Hypothyroidism, unspecified: Secondary | ICD-10-CM | POA: Insufficient documentation

## 2022-05-13 DIAGNOSIS — N189 Chronic kidney disease, unspecified: Secondary | ICD-10-CM | POA: Diagnosis not present

## 2022-05-13 DIAGNOSIS — Z79899 Other long term (current) drug therapy: Secondary | ICD-10-CM | POA: Insufficient documentation

## 2022-05-13 DIAGNOSIS — W19XXXA Unspecified fall, initial encounter: Secondary | ICD-10-CM | POA: Diagnosis not present

## 2022-05-13 DIAGNOSIS — S8991XA Unspecified injury of right lower leg, initial encounter: Secondary | ICD-10-CM | POA: Insufficient documentation

## 2022-05-13 DIAGNOSIS — R55 Syncope and collapse: Secondary | ICD-10-CM | POA: Insufficient documentation

## 2022-05-13 DIAGNOSIS — I129 Hypertensive chronic kidney disease with stage 1 through stage 4 chronic kidney disease, or unspecified chronic kidney disease: Secondary | ICD-10-CM | POA: Insufficient documentation

## 2022-05-13 DIAGNOSIS — M545 Low back pain, unspecified: Secondary | ICD-10-CM | POA: Diagnosis not present

## 2022-05-13 LAB — URINALYSIS, ROUTINE W REFLEX MICROSCOPIC
Bilirubin Urine: NEGATIVE
Glucose, UA: NEGATIVE mg/dL
Hgb urine dipstick: NEGATIVE
Ketones, ur: NEGATIVE mg/dL
Leukocytes,Ua: NEGATIVE
Nitrite: NEGATIVE
Protein, ur: NEGATIVE mg/dL
Specific Gravity, Urine: 1.011 (ref 1.005–1.030)
pH: 6 (ref 5.0–8.0)

## 2022-05-13 LAB — COMPREHENSIVE METABOLIC PANEL
ALT: 17 U/L (ref 0–44)
AST: 18 U/L (ref 15–41)
Albumin: 4.2 g/dL (ref 3.5–5.0)
Alkaline Phosphatase: 89 U/L (ref 38–126)
Anion gap: 9 (ref 5–15)
BUN: 17 mg/dL (ref 8–23)
CO2: 26 mmol/L (ref 22–32)
Calcium: 9.7 mg/dL (ref 8.9–10.3)
Chloride: 99 mmol/L (ref 98–111)
Creatinine, Ser: 0.74 mg/dL (ref 0.44–1.00)
GFR, Estimated: >60 mL/min (ref >60)
Glucose, Bld: 88 mg/dL (ref 70–99)
Potassium: 3.4 mmol/L — ABNORMAL LOW (ref 3.5–5.1)
Sodium: 134 mmol/L — ABNORMAL LOW (ref 135–145)
Total Bilirubin: 0.8 mg/dL (ref 0.3–1.2)
Total Protein: 6.7 g/dL (ref 6.5–8.1)

## 2022-05-13 LAB — LACTIC ACID, PLASMA: Lactic Acid, Venous: 0.5 mmol/L (ref 0.5–1.9)

## 2022-05-13 LAB — CBC WITH DIFFERENTIAL/PLATELET
Abs Immature Granulocytes: 0.04 10*3/uL (ref 0.00–0.07)
Basophils Absolute: 0 10*3/uL (ref 0.0–0.1)
Basophils Relative: 0 %
Eosinophils Absolute: 0 10*3/uL (ref 0.0–0.5)
Eosinophils Relative: 0 %
HCT: 38.8 % (ref 36.0–46.0)
Hemoglobin: 12.8 g/dL (ref 12.0–15.0)
Immature Granulocytes: 0 %
Lymphocytes Relative: 13 %
Lymphs Abs: 1.3 10*3/uL (ref 0.7–4.0)
MCH: 30.4 pg (ref 26.0–34.0)
MCHC: 33 g/dL (ref 30.0–36.0)
MCV: 92.2 fL (ref 80.0–100.0)
Monocytes Absolute: 0.4 10*3/uL (ref 0.1–1.0)
Monocytes Relative: 4 %
Neutro Abs: 8 10*3/uL — ABNORMAL HIGH (ref 1.7–7.7)
Neutrophils Relative %: 83 %
Platelets: 195 10*3/uL (ref 150–400)
RBC: 4.21 MIL/uL (ref 3.87–5.11)
RDW: 13.7 % (ref 11.5–15.5)
WBC: 9.7 10*3/uL (ref 4.0–10.5)
nRBC: 0 % (ref 0.0–0.2)

## 2022-05-13 LAB — LIPASE, BLOOD: Lipase: 16 U/L (ref 11–51)

## 2022-05-13 LAB — CBG MONITORING, ED: Glucose-Capillary: 84 mg/dL (ref 70–99)

## 2022-05-13 LAB — MAGNESIUM: Magnesium: 1.9 mg/dL (ref 1.7–2.4)

## 2022-05-13 LAB — TROPONIN I (HIGH SENSITIVITY): Troponin I (High Sensitivity): 4 ng/L (ref <18)

## 2022-05-13 NOTE — ED Provider Notes (Signed)
[facility]MENT AT [facility] Provider Note   CSN: [id number] Arrival date & time: 05/13/22  1337     History  Chief Complaint  Patient presents with   Loss of Consciousness    Karen Horne is a 72 y.o. female with HTN, HLD, hypothyroidism, GERD, IBS, fatty liver, hemorrhoids, h/o syncope who presents with LOC.   She states yesterday evening she experienced sudden sharp lower abdominal pain. Got into the bathroom, felt acutely lightheaded and like she was going to pass out, and syncopized in the bathroom. Woke up and felt okay, no confusion/lethargy. Has had similar episodes in the past twice, including the same abdominal pain. When she fell she twisted her R knee and now it is swollen and painful. She has been able to bear weight on the knee but mildly painful. Has been able to ambulate but with pain. Also having pain in lower back, bilateral and midline L spine pain that started after the fall. She is unsure if she hit her head but did not have any headache or neck pain. Occurred at 9:30 PM last night. Right afterward had an episode of diarrhea but hasn't had any more today. Denies urinary symptoms, constipation, melena/hematochezia, fevers/chills, recent illnesses, chest pain, shortness of breath, palpitations, nausea/vomiting, leg swelling. Just had colonoscopy about 1 month ago which was normal.      Loss of Consciousness      Home Medications Prior to Admission medications   Medication Sig Start Date End Date Taking? Authorizing Provider  dicyclomine (BENTYL) 10 MG capsule Take 1 capsule (10 mg total) by mouth 2 (two) times daily as needed for spasms. 04/26/22 05/26/22  Lynann Bologna, MD  levothyroxine (SYNTHROID, LEVOTHROID) 75 MCG tablet Take 75 mcg by mouth daily before breakfast.    [provider]  lisinopril-hydrochlorothiazide (ZESTORETIC) 10-12.5 MG tablet Take 1 tablet by mouth daily. 04/23/20   [provider]  traZODone  (DESYREL) 50 MG tablet Take 50 mg by mouth at bedtime as needed. 03/20/22   [provider]  venlafaxine XR (EFFEXOR-XR) 75 MG 24 hr capsule Take 75 mg by mouth at bedtime.    [provider]      Allergies    Adhesive [tape]    Review of Systems   Review of Systems  Cardiovascular:  Positive for syncope.   Review of systems Negative for f/c.  A 10 point review of systems was performed and is negative unless otherwise reported in HPI.  Physical Exam Updated Vital Signs BP 129/66 (BP Location: Right Arm)   Pulse 70   Temp 98.8 F (37.1 C) (Oral)   Resp 18   SpO2 97%  Physical Exam General: Normal appearing female, lying in bed.  HEENT: PERRLA, EOMI, no nystagmus, Sclera anicteric, MMM, trachea midline. NCAT. No midline C-spine TTP.  Cardiology: RRR, no murmurs/rubs/gallops. BL radial and DP pulses equal bilaterally.  Resp: Normal respiratory rate and effort. CTAB, no wheezes, rhonchi, crackles.  Abd: Soft, non-tender, non-distended. No rebound tenderness or guarding.  GU: Deferred. MSK: Mild effusion to L knee without any deformities, wounds, or obvious dislocations. Mild anterior joint line TTP. Able to flex/extend but with pain. Neg anterior/posterior drawer. NVI of BL LEs.  Skin: warm, dry. No rashes or lesions. Back: No midline T spine TTP without any deformities/stepoffs. +BL paraspinal lumbar TTP with +midline TTP over L4-5. No stepoffs or deformities, no gross signs of trauma.  Neuro: A&Ox4, CNs II-XII grossly intact. MAEs. Sensation grossly intact.  Psych:  Normal mood and affect.   ED Results / Procedures / Treatments   Labs (all labs ordered are listed, but only abnormal results are displayed) Labs Reviewed  CBC WITH DIFFERENTIAL/PLATELET - Abnormal; Notable for the following components:      Result Value   Neutro Abs 8.0 (*)    All other components within normal limits  COMPREHENSIVE METABOLIC PANEL - Abnormal; Notable for the following components:    Sodium 134 (*)    Potassium 3.4 (*)    All other components within normal limits  URINALYSIS, ROUTINE W REFLEX MICROSCOPIC - Abnormal; Notable for the following components:   Color, Urine COLORLESS (*)    All other components within normal limits  LIPASE, BLOOD  LACTIC ACID, PLASMA  MAGNESIUM  CBG MONITORING, ED  TROPONIN I (HIGH SENSITIVITY)    EKG EKG Interpretation  Date/Time:  Saturday May 13 2022 13:56:11 EDT Ventricular Rate:  84 PR Interval:  160 QRS Duration: 80 QT Interval:  362 QTC Calculation: 427 R Axis:   25 Text Interpretation: Normal sinus rhythm no stemi No significant change since last tracing Confirmed by Vivien Rossetti ([id number]) on 05/13/2022 2:00:43 PM  Radiology CT Lumbar Spine Wo Contrast  Result Date: 05/13/2022 CLINICAL DATA:  Back trauma, no prior imaging (Age >= 16y) EXAM: CT LUMBAR SPINE WITHOUT CONTRAST TECHNIQUE: Multidetector CT imaging of the lumbar spine was performed without intravenous contrast administration. Multiplanar CT image reconstructions were also generated. RADIATION DOSE REDUCTION: This exam was performed according to the departmental dose-optimization program which includes automated exposure control, adjustment of the mA and/or kV according to patient size and/or use of iterative reconstruction technique. COMPARISON:  None Available. FINDINGS: Segmentation: 5 lumbar type vertebrae.  Hypoplastic left rib at T12. Alignment: Grade 1 anterolisthesis of L4 on L5 secondary to degenerative facet arthropathy. Vertebrae: Lumbar vertebral body heights are maintained. No acute fracture. Discogenic endplate changes at L2-L3. No lytic or sclerotic bone lesion. Paraspinal and other soft tissues: Aortic atherosclerosis. No acute abnormality. Disc levels: Advanced disc height loss at the L2-L3 level. Moderate lower lumbar facet arthropathy is most severe at the L4-L5 level. At least mild canal stenosis is suspected at the L4-L5 level. Foraminal  narrowing appears most pronounced on the right at L2-L3 and on the left at L3-L4. IMPRESSION: 1. No acute fracture or traumatic malalignment of the lumbar spine. 2. 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 Moderate lower lumbar facet arthropathy is most severe at the L4-L5 level. Aortic Atherosclerosis (ICD10-I70.0). Electronically Signed   By: Duanne Guess D.O.   On: 05/13/2022 17:28   DG Knee 2 Views Right  Result Date: 05/13/2022 CLINICAL DATA:  Right knee pain after fall EXAM: RIGHT KNEE - 2 VIEW COMPARISON:  None Available. FINDINGS: No acute fracture or dislocation. Moderate tricompartment osteophytosis, greatest in the medial compartment. No joint effusion. No soft tissue abnormalities. IMPRESSION: 1. No acute fracture or dislocation. 2. Moderate osteoarthritis. Electronically Signed   By: Jacob Moores M.D.   On: 05/13/2022 17:01    Procedures Procedures    Medications Ordered in ED Medications - No data to display  ED Course/ Medical Decision Making/ A&P                          Medical Decision Making Amount and/or Complexity of Data Reviewed Labs: ordered. Decision-making details documented in ED Course. Radiology: ordered. Decision-making details documented in ED Course.    This patient presents to the ED for concern of  syncope, back pain, knee pain; this involves an extensive number of treatment options, and is a complaint that carries with it a high risk of complications and morbidity.  I considered the following differential and admission for this acute, potentially life threatening condition.  Overall patient is very well-appearing and hemodynamically stable.  MDM:    DDX for syncope includes but is not limited to:  Consider anemia and electrolyte abnormalities as possible etiologies. Given history, exam and workup, low suspicion for HF, ICH (no trauma, headache), seizure (no witnessed seizure like activity, no postictal period, tongue laceration, bladder incontinence),  stroke (no focal neuro deficits), ACS (neg troponin, no anginal pain), aortic dissection (no chest pain), malignant arrhythmia on ekg or any family history of sudden death, or GI bleed (stable hgb). Low suspicion for PE given normal vital signs, absence of chest pain or dyspnea, no evidence of DVT, no recent surgery/immobilization.  Patient states that this is similar to her prior 2 episodes of syncope which was diagnosed in the past as vasovagal.  She did have prodrome of symptoms including lightheadedness and sensation that she was going to pass out, without any associated chest pain palpitation shortness of breath.  She did have a preceding episode of abdominal pain followed by a bowel movement which she states also happened in the past, she has no abdominal pain now, no tenderness palpation, no signs of acute abdomen on exam, lower concern for intra-abdominal pathology including AAA/dissection (though she is complaining of back pain but as described below this appears to be c/w MSK TTP/pain), intra-abdominal infection.  The syncope also occurred nearly 24 hours ago, unlikely to represent intraabdominal hemorrhage.  For patient's knee pain after fall: No abnormalities on exam, patient is able to bear weight. Able to flex and extend although somewhat limited by pain. Considered ligamentous/tendon injury or knee sprain/strain. Considered, but doubt, tibial plateau fracture, septic arthritis, other acute unstable fracture, or significant neurovascular compromise. XR doesn't show any signs of acute fracture. Presentation most consistent with knee sprain without joint instability. History, Exam, and Workup not consistent with fracture, compartment syndrome, arterial or nerve injury. Rx: R.I.C.E w/ knee immobilizer and crutches and ortho f/u.   For patient's low back pain d/t fall, ddx includes but is not limited to:  She has muscular and bony tenderness to palpation of her lumbar spine on palpation.   Consider most likely musculoskeletal cause of back pain rather than intra-abdominal or retroperitoneal cause.  Consider possible fracture given traumatic cause of pain and w/ midline L-spine tenderness palpation around L4-L5, will obtain CT L-spine.  She has no radicular symptoms or signs of cauda equina (no bowel or urinary incontinence/retention, no saddle anesthesia, no distal weakness).  She has not no urinary symptoms or fever to indicate pyelonephritis.  She is hemodynamically stable and fall occurred several hours ago, very low concern for retroperitoneal bleed.   Clinical Course as of 05/13/22 1834  Sat May 13, 2022  1610 Glucose-Capillary: 84 [HN]  1706 Urinalysis, Routine w reflex microscopic -Urine, Clean Catch(!) wnl [HN]  1707 DG Knee 2 Views Right IMPRESSION: 1. No acute fracture or dislocation. 2. Moderate osteoarthritis.   [HN]  1829 CT Lumbar Spine Wo Contrast 1. No acute fracture or traumatic malalignment of the lumbar spine. 2. 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 Moderate lower lumbar facet arthropathy is most severe at the L4-L5 level.  Aortic Atherosclerosis (ICD10-I70.0).   [HN]  1830 Magnesium: 1.9 [HN]  1830 Troponin I (High Sensitivity): 4 [HN]  1830 Lactic Acid, Venous: 0.5 [HN]  1830 Lipase: 16 [HN]  1830 Comprehensive metabolic panel(!) Very mild hyponatremia/hypokalemia, otherwise unremarkable CMP [HN]  1830 CBC with Differential(!) unremarkable [HN]    Clinical Course User Index [HN] Loetta Rough, MD    Labs: I Ordered, and personally interpreted labs.  The pertinent results include:  those listed above  Imaging Studies ordered: I ordered imaging studies including CT L spine, R knee XR I independently visualized and interpreted imaging. I agree with the radiologist interpretation  Additional history obtained from chart review, husband at bedside.    Cardiac Monitoring: The patient was maintained on a cardiac monitor.  I personally viewed  and interpreted the cardiac monitored which showed an underlying rhythm of: NSR  Reevaluation: After the interventions noted above, I reevaluated the patient and found that they have :improved  Social Determinants of Health: Patient lives independently   Disposition: Patient with lab workup that is overall unremarkable and reassuring.  Her troponin is negative.  Her symptoms felt exactly like her prior 2 episodes of vasovagal syncope in the past and patient has been at her baseline now since the event.  From this perspective I believe that patient is safe to follow-up as an outpatient.  Patient is reassuring right knee x-ray and CT lumbar spine as well with no acute fracture or dislocation.  Patient is given a knee immobilizer and crutches to help her ambulate and instructed to RICE her right lower extremity.  Has an orthopedic surgeon she can call if her symptoms do not improve for follow-up.  Advised to follow-up with her PCP within 1 to 2 weeks.  Discharged with discharge instructions and return precautions, all questions answered to patient satisfaction.   Co morbidities that complicate the patient evaluation  Past Medical History:  Diagnosis Date   Arthritis    Chronic kidney disease pyelonephritis   Depression    Fatty liver    GERD (gastroesophageal reflux disease)    surgery for acid reflux 10 yrs ago   Hemorrhoids    Hypercholesterolemia    Hypertension    Hypothyroidism    IBS (irritable bowel syndrome)      Medicines No orders of the defined types were placed in this encounter.   I have reviewed the patients home medicines and have made adjustments as needed  Problem List / ED Course: Problem List Items Addressed This Visit       Other   Syncope - Primary   Other Visit Diagnoses     Injury of right knee, initial encounter       Acute bilateral low back pain without sciatica                       This note was created using dictation software,  which may contain spelling or grammatical errors.    Loetta Rough, MD 05/19/22 1409

## 2022-05-13 NOTE — ED Triage Notes (Signed)
She states she experienced sudden low abd. Pain followed by a brief syncope yesterday evening. She c/o right knee and low back pain after the fall caused by the syncope.

## 2022-05-13 NOTE — Discharge Instructions (Signed)
Thank you for coming to [facility] Emergency Department. You were seen for passing out, back pain, and knee pain. We did an exam, labs, and imaging, and these showed no acute findings. Your knee was swollen and painful, we provided you a brace to use if it helps you walk as well as crutches.  Please rest, ice, and elevate your knee to help reduce the swelling and pain. You can alternate taking Tylenol and ibuprofen as needed for pain. You can take 650mg  tylenol (acetaminophen) every 4-6 hours, and 600 mg ibuprofen 3 times a day.  You could use over-the-counter lidocaine patches for your back or your knee if these help your symptoms.  It is possible that you have another injury to the knee that is not noted on x-ray that would be diagnosed with more advanced imaging by an orthopedic surgeon such as an MRI.  If your knee pain does not improve in the next 1 to 2 weeks, please call and schedule appointment for follow-up with your orthopedic doctor. Please stay well-hydrated at home.  Please follow up with your primary care provider within 1 week.   Return to the ED if you develop any of the following: - Fever (100.4 F or 38 C) or chills at home that do not respond to over the counter medications - Weakness, numbness, or tingling in your extremities - Difficulty emptying bladder / urinary incontinence - Fecal incontinence -Lightheadedness/loss of consciousness/passing out - Uncontrolled nausea/vomiting with inability to keep down liquids - Feeling as though you are going to pass out or passing out - Anything else that concerns you

## 2022-05-18 ENCOUNTER — Other Ambulatory Visit: Payer: Self-pay | Admitting: Gastroenterology

## 2022-06-23 ENCOUNTER — Telehealth: Payer: Self-pay | Admitting: Gastroenterology

## 2022-06-23 NOTE — Telephone Encounter (Signed)
Line busy

## 2022-06-23 NOTE — Telephone Encounter (Signed)
Patient called requesting a call back to discuss hemorrhoids that she is currently experiencing. States she spoke with Dr. Chales Abrahams regarding her hemorrhoids during her last colonoscopy on 3/27. Requesting a call back regarding next steps. Please advise, thank you.

## 2022-06-27 NOTE — Telephone Encounter (Signed)
Pt stated that she has been having some discomfort from her hemorrhoids and seeing a small amount of blood on the toilet tissue after having a BM. Marland KitchenPt stated that she has been using OTC cream, wet wipes, and sitz bath. Pt requesting only to be scheduled with Dr. Chales Abrahams. Pt was scheduled to see Dr. Chales Abrahams on 09/19/2022 at 9:30 AM. Pt made aware. Pt verbalized understanding with all questions answered.

## 2022-06-27 NOTE — Telephone Encounter (Signed)
Left message for pt to call back  °

## 2022-08-02 ENCOUNTER — Other Ambulatory Visit: Payer: Self-pay | Admitting: Orthopedic Surgery

## 2022-08-17 ENCOUNTER — Ambulatory Visit: Admit: 2022-08-17 | Payer: Medicare HMO | Admitting: Orthopedic Surgery

## 2022-08-17 SURGERY — ARTHROPLASTY, SHOULDER, TOTAL, REVERSE
Anesthesia: Choice | Site: Shoulder | Laterality: Right

## 2022-08-22 ENCOUNTER — Other Ambulatory Visit: Payer: Self-pay | Admitting: Gastroenterology

## 2022-09-19 ENCOUNTER — Ambulatory Visit: Payer: Medicare HMO | Admitting: Gastroenterology

## 2022-09-19 ENCOUNTER — Encounter: Payer: Self-pay | Admitting: Gastroenterology

## 2022-09-19 VITALS — BP 116/64 | HR 80 | Ht 64.0 in | Wt 158.4 lb

## 2022-09-19 DIAGNOSIS — R1013 Epigastric pain: Secondary | ICD-10-CM | POA: Diagnosis not present

## 2022-09-19 DIAGNOSIS — K449 Diaphragmatic hernia without obstruction or gangrene: Secondary | ICD-10-CM | POA: Diagnosis not present

## 2022-09-19 DIAGNOSIS — K219 Gastro-esophageal reflux disease without esophagitis: Secondary | ICD-10-CM | POA: Diagnosis not present

## 2022-09-19 DIAGNOSIS — K580 Irritable bowel syndrome with diarrhea: Secondary | ICD-10-CM

## 2022-09-19 NOTE — Patient Instructions (Signed)
_______________________________________________________  If your blood pressure at your visit was 140/90 or greater, please contact your primary care physician to follow up on this.  _______________________________________________________  If you are age 73 or older, your body mass index should be between 23-30. Your Body mass index is 27.19 kg/m. If this is out of the aforementioned range listed, please consider follow up with your Primary Care Provider.  If you are age 23 or younger, your body mass index should be between 19-25. Your Body mass index is 27.19 kg/m. If this is out of the aformentioned range listed, please consider follow up with your Primary Care Provider.   ________________________________________________________  The [facility] providers would like to encourage you to use [facility] to communicate with providers for non-urgent requests or questions.  Due to long hold times on the telephone, sending your provider a message by [facility] may be a faster and more efficient way to get a response.  Please allow 48 business hours for a response.  Please remember that this is for non-urgent requests.  _______________________________________________________  Please take your medications and call in 2 weeks to let us know how you are doing.  Thank you,  Dr. Lynann Bologna

## 2022-09-19 NOTE — Progress Notes (Signed)
Chief Complaint:   Referring Provider:  No ref. provider found      ASSESSMENT AND PLAN;   #1. GERD with HH s/p fundoplication  #2. Epi pain. Neg [place] 12/2021 except for fatty liver. Nl GB  #3. IBS-D. Neg colon 03/2022 with neg Bx.  #4.  Internal hemorrhoids   Plan: -Start omeprazole 40mg  po every day  -Trial of Bentyl 10mg  po BID as needed only. -She will call us in 2 weeks to let us know how she is doing. -Continue Preparation H on as needed basis.  If hemorrhoidal problems in future, consider banding -If still with problems, CT AP with contrast followed by EGD.  If still with problems, would consider HIDA with EF   HPI:    Karen Horne is a 72 y.o. female   For follow-up  Did not take any of her medications  Still does feel better except for GERD with postprandial epi pain with abdominal bloating. Neg [place] 12/2021 Intermittent diarrhea without melena Hemorrhoids are somewhat better with Preparation H  Denies any history suggestive of lactose or gluten intolerance No nonsteroidals  She has gained weight as below Wt Readings from Last 3 Encounters:  09/19/22 158 lb 6 oz (71.8 kg)  04/26/22 150 lb (68 kg)  02/24/22 150 lb (68 kg)    Past GI workup: Colonoscopy 04/26/2022 (for positive Cologuard) -Mild sigmoid diverticulosis -Otherwise normal colonoscopy to TI. -Negative random colon biopsies for microscopic colitis -No need to repeat unless any new problems  Previously Dr. Jarold Motto patient.  10/30/2007 endoscopy for abdominal pain showed intact fundoplication normal proximal distal esophagus, normal stomach and duodenum. Pathology showed benign small bowel mucosa no celiac 10/30/2007 Colonoscopy for screening showed 5 mm hyperplastic polyp, recall 10 years.  01/16/2022 RUQ US hepatic steatosis, GB normal, CBD normal.     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   Procedure: TOTAL KNEE ARTHROPLASTY;  Surgeon: Velna Ochs, MD;  Location: [facility] OR;  Service: Orthopedics;  Laterality: Left;  Left total knee replacement    Family History  Problem Relation Age of Onset   High blood pressure Mother    High blood pressure Father    High blood pressure Maternal Grandmother    High blood pressure Maternal Grandfather    High blood pressure Paternal Grandmother    High blood pressure Paternal Grandfather    Liver disease Neg Hx    Cancer - Colon Neg Hx    Esophageal cancer Neg Hx    Colon cancer Neg Hx    Stomach cancer Neg Hx    Rectal cancer Neg Hx     Social History   Tobacco Use   Smoking status: Never   Smokeless tobacco: Never  Vaping Use   Vaping status: Never Used  Substance Use Topics   Alcohol use: No    Comment: 1 a week   Drug use: No    Current Outpatient Medications  Medication Sig Dispense Refill   cyanocobalamin  1000 MCG tablet Take 1,000 mcg by mouth daily.     dicyclomine (BENTYL) 10 MG capsule TAKE 1 CAPSULE (10 MG TOTAL) BY MOUTH 2 (TWO) TIMES DAILY AS NEEDED FOR SPASMS. 180 capsule 0   fluticasone (FLONASE) 50 MCG/ACT nasal spray Place 1 spray into both nostrils daily.     levothyroxine (SYNTHROID, LEVOTHROID) 75 MCG tablet Take 75 mcg by mouth daily before breakfast.     lisinopril-hydrochlorothiazide (ZESTORETIC) 10-12.5 MG tablet Take 1 tablet by mouth daily.     omeprazole (PRILOSEC) 40 MG capsule Take 40 mg by mouth daily.     rosuvastatin (CRESTOR) 5 MG tablet Take 5 mg by mouth daily.     traZODone (DESYREL) 100 MG tablet Take 100 mg by mouth at bedtime.     venlafaxine XR (EFFEXOR-XR)  75 MG 24 hr capsule Take 75 mg by mouth at bedtime.     No current facility-administered medications for this visit.    Allergies  Allergen Reactions   Adhesive [Tape] Rash    Review of Systems:  Constitutional: Denies fever, chills, diaphoresis, appetite change and fatigue.  HEENT: Denies photophobia, eye pain, redness, hearing loss, ear pain, congestion, sore throat, rhinorrhea, sneezing, mouth sores, neck pain, neck stiffness and tinnitus.   Respiratory: Denies SOB, DOE, cough, chest tightness,  and wheezing.   Cardiovascular: Denies chest pain, palpitations and leg swelling.  Genitourinary: Denies dysuria, urgency, frequency, hematuria, flank pain and difficulty urinating.  Musculoskeletal: Denies myalgias, back pain, joint swelling, arthralgias and gait problem.  Skin: No rash.  Neurological: Denies dizziness, seizures, syncope, weakness, light-headedness, numbness and headaches.  Hematological: Denies adenopathy. Easy bruising, personal or family bleeding history  Psychiatric/Behavioral: No anxiety or depression     Physical Exam:    BP 116/64 (BP Location: Left Arm, Patient Position: Sitting, Cuff Size: Normal)   Pulse 80   Ht 5\' 4"  (1.626 m)   Wt 158 lb 6 oz (71.8 kg)   BMI 27.19 kg/m  Wt Readings from Last 3 Encounters:  09/19/22 158 lb 6 oz (71.8 kg)  04/26/22 150 lb (68 kg)  02/24/22 150 lb (68 kg)   Constitutional:  Well-developed, in no acute distress. Psychiatric: Normal mood and affect. Behavior is normal. HEENT: Pupils normal.  Conjunctivae are normal. No scleral icterus. Neck supple.  Cardiovascular: Normal rate, regular rhythm. No edema Pulmonary/chest: Effort normal and breath sounds normal. No wheezing, rales or rhonchi. Abdominal: Soft, nondistended. Nontender. Bowel sounds active throughout. There are no masses palpable. No hepatomegaly. Rectal: Deferred Neurological: Alert and oriented to person place and time. Skin: Skin is warm and dry. No rashes  noted.  Data Reviewed: I have personally reviewed following labs and imaging studies  CBC:    Latest Ref Rng & Units 05/13/2022    5:15 PM 02/24/2022    9:34 AM 02/26/2017    5:24 AM  CBC  WBC 4.0 - 10.5 K/uL 9.7  5.9  7.1   Hemoglobin 12.0 - 15.0 g/dL 16.1  09.6  04.5   Hematocrit 36.0 - 46.0 % 38.8  39.9  37.0   Platelets 150 - 400 K/uL 195  214.0  183     CMP:    Latest Ref Rng & Units 05/13/2022    5:15 PM 02/24/2022    9:34 AM 02/26/2017    5:24 AM  CMP  Glucose 70 - 99 mg/dL 88  92  409   BUN 8 - 23 mg/dL 17  17  26    Creatinine 0.44 -  1.00 mg/dL 4.09  8.11  9.14   Sodium 135 - 145 mmol/L 134  136  137   Potassium 3.5 - 5.1 mmol/L 3.4  4.0  3.7   Chloride 98 - 111 mmol/L 99  100  105   CO2 22 - 32 mmol/L 26  28  20    Calcium 8.9 - 10.3 mg/dL 9.7  9.3  8.9   Total Protein 6.5 - 8.1 g/dL 6.7  6.8    Total Bilirubin 0.3 - 1.2 mg/dL 0.8  0.4    Alkaline Phos 38 - 126 U/L 89  82    AST 15 - 41 U/L 18  18    ALT 0 - 44 U/L 17  11      GFR: CrCl cannot be calculated (Patient's most recent lab result is older than the maximum 21 days allowed.). Liver Function Tests: No results for input(s): "AST", "ALT", "ALKPHOS", "BILITOT", "PROT", "ALBUMIN" in the last 168 hours. No results for input(s): "LIPASE", "AMYLASE" in the last 168 hours. No results for input(s): "AMMONIA" in the last 168 hours. Coagulation Profile: No results for input(s): "INR", "PROTIME" in the last 168 hours. HbA1C: No results for input(s): "HGBA1C" in the last 72 hours. Lipid Profile: No results for input(s): "CHOL", "HDL", "LDLCALC", "TRIG", "CHOLHDL", "LDLDIRECT" in the last 72 hours. Thyroid Function Tests: No results for input(s): "TSH", "T4TOTAL", "FREET4", "T3FREE", "THYROIDAB" in the last 72 hours. Anemia Panel: No results for input(s): "VITAMINB12", "FOLATE", "FERRITIN", "TIBC", "IRON", "RETICCTPCT" in the last 72 hours.  No results found for this or any previous visit (from the past 240  hour(s)).    Radiology Studies: No results found.    Edman Circle, MD 09/19/2022, 9:46 AM  Cc: No ref. provider found

## 2022-11-13 ENCOUNTER — Other Ambulatory Visit: Payer: Self-pay | Admitting: Gastroenterology

## 2023-02-22 ENCOUNTER — Other Ambulatory Visit: Payer: Self-pay | Admitting: Family Medicine

## 2023-02-22 DIAGNOSIS — Z139 Encounter for screening, unspecified: Secondary | ICD-10-CM

## 2023-03-07 ENCOUNTER — Ambulatory Visit: Payer: Medicare HMO

## 2023-03-26 ENCOUNTER — Ambulatory Visit
Admission: RE | Admit: 2023-03-26 | Discharge: 2023-03-26 | Disposition: A | Discharge status: Home / Self Care | Payer: Medicare Other | Source: Ambulatory Visit | Attending: Family Medicine | Admitting: Family Medicine

## 2023-03-26 DIAGNOSIS — Z139 Encounter for screening, unspecified: Secondary | ICD-10-CM

## 2023-05-04 ENCOUNTER — Other Ambulatory Visit: Payer: Self-pay

## 2023-05-04 ENCOUNTER — Emergency Department (HOSPITAL_BASED_OUTPATIENT_CLINIC_OR_DEPARTMENT_OTHER)

## 2023-05-04 ENCOUNTER — Emergency Department (HOSPITAL_BASED_OUTPATIENT_CLINIC_OR_DEPARTMENT_OTHER): Admission: EM | Admit: 2023-05-04 | Discharge: 2023-05-04 | Disposition: A | Discharge status: Home / Self Care

## 2023-05-04 ENCOUNTER — Emergency Department (HOSPITAL_BASED_OUTPATIENT_CLINIC_OR_DEPARTMENT_OTHER): Admitting: Radiology

## 2023-05-04 ENCOUNTER — Encounter (HOSPITAL_BASED_OUTPATIENT_CLINIC_OR_DEPARTMENT_OTHER): Payer: Self-pay

## 2023-05-04 DIAGNOSIS — R531 Weakness: Secondary | ICD-10-CM | POA: Diagnosis not present

## 2023-05-04 DIAGNOSIS — N189 Chronic kidney disease, unspecified: Secondary | ICD-10-CM | POA: Insufficient documentation

## 2023-05-04 DIAGNOSIS — Z79899 Other long term (current) drug therapy: Secondary | ICD-10-CM | POA: Diagnosis not present

## 2023-05-04 DIAGNOSIS — E039 Hypothyroidism, unspecified: Secondary | ICD-10-CM | POA: Insufficient documentation

## 2023-05-04 DIAGNOSIS — J168 Pneumonia due to other specified infectious organisms: Secondary | ICD-10-CM | POA: Diagnosis not present

## 2023-05-04 DIAGNOSIS — R5383 Other fatigue: Secondary | ICD-10-CM | POA: Insufficient documentation

## 2023-05-04 DIAGNOSIS — E876 Hypokalemia: Secondary | ICD-10-CM | POA: Diagnosis not present

## 2023-05-04 DIAGNOSIS — J189 Pneumonia, unspecified organism: Secondary | ICD-10-CM

## 2023-05-04 DIAGNOSIS — I129 Hypertensive chronic kidney disease with stage 1 through stage 4 chronic kidney disease, or unspecified chronic kidney disease: Secondary | ICD-10-CM | POA: Insufficient documentation

## 2023-05-04 DIAGNOSIS — R7989 Other specified abnormal findings of blood chemistry: Secondary | ICD-10-CM | POA: Insufficient documentation

## 2023-05-04 DIAGNOSIS — R059 Cough, unspecified: Secondary | ICD-10-CM | POA: Diagnosis present

## 2023-05-04 LAB — CBC
HCT: 44.3 % (ref 36.0–46.0)
Hemoglobin: 15.2 g/dL — ABNORMAL HIGH (ref 12.0–15.0)
MCH: 30.9 pg (ref 26.0–34.0)
MCHC: 34.3 g/dL (ref 30.0–36.0)
MCV: 90 fL (ref 80.0–100.0)
Platelets: 375 10*3/uL (ref 150–400)
RBC: 4.92 MIL/uL (ref 3.87–5.11)
RDW: 13.5 % (ref 11.5–15.5)
WBC: 10.8 10*3/uL — ABNORMAL HIGH (ref 4.0–10.5)
nRBC: 0 % (ref 0.0–0.2)

## 2023-05-04 LAB — COMPREHENSIVE METABOLIC PANEL WITH GFR
ALT: 17 U/L (ref 0–44)
AST: 16 U/L (ref 15–41)
Albumin: 4 g/dL (ref 3.5–5.0)
Alkaline Phosphatase: 68 U/L (ref 38–126)
Anion gap: 11 (ref 5–15)
BUN: 44 mg/dL — ABNORMAL HIGH (ref 8–23)
CO2: 23 mmol/L (ref 22–32)
Calcium: 9.7 mg/dL (ref 8.9–10.3)
Chloride: 100 mmol/L (ref 98–111)
Creatinine, Ser: 1.6 mg/dL — ABNORMAL HIGH (ref 0.44–1.00)
GFR, Estimated: 34 mL/min — ABNORMAL LOW (ref >60)
Glucose, Bld: 105 mg/dL — ABNORMAL HIGH (ref 70–99)
Potassium: 3.1 mmol/L — ABNORMAL LOW (ref 3.5–5.1)
Sodium: 134 mmol/L — ABNORMAL LOW (ref 135–145)
Total Bilirubin: 0.6 mg/dL (ref 0.0–1.2)
Total Protein: 7 g/dL (ref 6.5–8.1)

## 2023-05-04 LAB — LIPASE, BLOOD: Lipase: 56 U/L — ABNORMAL HIGH (ref 11–51)

## 2023-05-04 MED ORDER — AZITHROMYCIN 250 MG PO TABS: 250.0000 mg | ORAL_TABLET | Freq: Every day | ORAL | 0 refills | Status: DC

## 2023-05-04 MED ORDER — SODIUM CHLORIDE 0.9 % IV BOLUS
1000.0000 mL | Freq: Once | INTRAVENOUS | Status: AC
  Administered 2023-05-04: 1000 mL via INTRAVENOUS

## 2023-05-04 MED ORDER — POTASSIUM CHLORIDE CRYS ER 20 MEQ PO TBCR
40.0000 meq | EXTENDED_RELEASE_TABLET | Freq: Once | ORAL | Status: DC
  Filled 2023-05-04: qty 2

## 2023-05-04 MED ORDER — AMOXICILLIN-POT CLAVULANATE 875-125 MG PO TABS: 1.0000 | ORAL_TABLET | Freq: Two times a day (BID) | ORAL | 0 refills | Status: AC

## 2023-05-04 MED ORDER — POTASSIUM CHLORIDE 20 MEQ PO PACK
40.0000 meq | PACK | Freq: Once | ORAL | Status: AC
  Administered 2023-05-04: 40 meq via ORAL
  Filled 2023-05-04: qty 2

## 2023-05-04 NOTE — ED Notes (Signed)
 Pt aware of the need for a urine... Unable to currently provide the sample.Marland KitchenMarland Kitchen

## 2023-05-04 NOTE — ED Provider Notes (Signed)
 [place] [facility] DEPARTMENT AT [facility] Provider Note   CSN: [id number] Arrival date & time: 05/04/23  0744     History  Chief Complaint  Patient presents with   Cough    Karen Horne is a 73 y.o. female.  This is a 73 year old female presents to the emergency department with generalized weakness and fatigue.  Has had cough for the same duration.  Reports some sinus pain as well for which he has taken prednisone taper and is currently taking amoxicillin which has been reports only has a few days left.  Patient with lightheadedness which is exacerbated by ambulation.  No syncope.  Not having chest pain.  Not having abdominal pain, but states that she does not have appetite.  No nausea no vomiting.  No urinary symptoms   Cough      Home Medications Prior to Admission medications   Medication Sig Start Date End Date Taking? Authorizing Provider  amoxicillin-clavulanate (AUGMENTIN) 875-125 MG tablet Take 1 tablet by mouth every 12 (twelve) hours for 7 days. 05/04/23 05/11/23 Yes [name], Harmon Dun, DO  azithromycin (ZITHROMAX) 250 MG tablet Take 1 tablet (250 mg total) by mouth daily. Take first 2 tablets together, then 1 every day until finished. 05/04/23  Yes Coral Spikes, DO  cyanocobalamin 1000 MCG tablet Take 1,000 mcg by mouth daily.    [provider]  dicyclomine (BENTYL) 10 MG capsule TAKE 1 CAPSULE (10 MG TOTAL) BY MOUTH 2 (TWO) TIMES DAILY AS NEEDED FOR SPASMS. 08/22/22   Lynann Bologna, MD  fluticasone [facility]) 50 MCG/ACT nasal spray Place 1 spray into both nostrils daily.    [provider]  levothyroxine (SYNTHROID, LEVOTHROID) 75 MCG tablet Take 75 mcg by mouth daily before breakfast.    [provider]  lisinopril-hydrochlorothiazide (ZESTORETIC) 10-12.5 MG tablet Take 1 tablet by mouth daily. 04/23/20   [provider]  omeprazole (PRILOSEC) 40 MG capsule Take 40 mg by mouth daily. 09/19/22   [provider]   rosuvastatin (CRESTOR) 5 MG tablet Take 5 mg by mouth daily. 08/11/22   [provider]  traZODone (DESYREL) 100 MG tablet Take 100 mg by mouth at bedtime. 08/18/22   [provider]  venlafaxine XR (EFFEXOR-XR) 75 MG 24 hr capsule Take 75 mg by mouth at bedtime.    [provider]      Allergies    Adhesive [tape]    Review of Systems   Review of Systems  Respiratory:  Positive for cough.     Physical Exam Updated Vital Signs BP 110/68   Pulse (!) 59   Temp 98.2 F (36.8 C) (Oral)   Resp 20   Ht 5\' 4"  (1.626 m)   Wt 70.3 kg   SpO2 97%   BMI 26.61 kg/m  Physical Exam Vitals and nursing note reviewed.  Constitutional:      General: She is not in acute distress.    Appearance: She is not toxic-appearing.  HENT:     Head: Normocephalic.     Nose: Nose normal.     Mouth/Throat:     Mouth: Mucous membranes are moist.  Eyes:     Conjunctiva/sclera: Conjunctivae normal.  Cardiovascular:     Rate and Rhythm: Normal rate.  Pulmonary:     Effort: Pulmonary effort is normal.     Breath sounds: Normal breath sounds. No wheezing, rhonchi or rales.  Abdominal:     General: Abdomen is flat. There is no distension.  Tenderness: There is no abdominal tenderness. There is no guarding or rebound.  Musculoskeletal:        General: Normal range of motion.  Skin:    General: Skin is warm and dry.     Capillary Refill: Capillary refill takes less than 2 seconds.  Neurological:     Mental Status: She is alert and oriented to person, place, and time.  Psychiatric:        Mood and Affect: Mood normal.        Behavior: Behavior normal.     ED Results / Procedures / Treatments   Labs (all labs ordered are listed, but only abnormal results are displayed) Labs Reviewed  CBC - Abnormal; Notable for the following components:      Result Value   WBC 10.8 (*)    Hemoglobin 15.2 (*)    All other components within normal limits  COMPREHENSIVE METABOLIC  PANEL WITH GFR - Abnormal; Notable for the following components:   Sodium 134 (*)    Potassium 3.1 (*)    Glucose, Bld 105 (*)    BUN 44 (*)    Creatinine, Ser 1.60 (*)    GFR, Estimated 34 (*)    All other components within normal limits  LIPASE, BLOOD - Abnormal; Notable for the following components:   Lipase 56 (*)    All other components within normal limits    EKG EKG Interpretation Date/Time:  Friday May 04 2023 08:20:25 EDT Ventricular Rate:  63 PR Interval:  162 QRS Duration:  103 QT Interval:  416 QTC Calculation: 426 R Axis:   19  Text Interpretation: Sinus rhythm Low voltage, precordial leads Confirmed by Estanislado Pandy [phone]) on 05/04/2023 9:41:17 AM  Radiology CT Chest Wo Contrast Result Date: 05/04/2023 CLINICAL DATA:  Pulmonary nodule on chest x-ray. EXAM: CT CHEST WITHOUT CONTRAST TECHNIQUE: Multidetector CT imaging of the chest was performed following the standard protocol without IV contrast. RADIATION DOSE REDUCTION: This exam was performed according to the departmental dose-optimization program which includes automated exposure control, adjustment of the mA and/or kV according to patient size and/or use of iterative reconstruction technique. COMPARISON:  02/25/2017 FINDINGS: Cardiovascular: The heart size is normal. No substantial pericardial effusion. Coronary artery calcification is evident. Mild atherosclerotic calcification is noted in the wall of the thoracic aorta. Mediastinum/Nodes: No mediastinal lymphadenopathy. No evidence for gross hilar lymphadenopathy although assessment is limited by the lack of intravenous contrast on the current study. The esophagus has normal imaging features. There is no axillary lymphadenopathy. Lungs/Pleura: Bronchial wall thickening with mild bronchiectasis and tree-in-bud opacities identified in the right upper lobe with nodular components measuring up to 8 mm. These changes likely account for the abnormality on the chest x-ray  earlier today and are new in the interval since prior study. Mild bronchiectasis with volume loss in clustered nodularity seen right middle lobe on image 100/3. Similar changes are seen in the medial right lower lobe on 85/3, and left upper lobe on 71/3. the most prominent patchy nodular tree-in-bud opacity is in the left lower lobe (image 80/3 with nodular components measuring up to 11 mm on image 85/3. No pleural effusion. Upper Abdomen: 16 mm water density lesion upper pole left kidney is compatible with a cyst. No followup imaging is recommended. Musculoskeletal: No worrisome lytic or sclerotic osseous abnormality. IMPRESSION: 1. Bronchial wall thickening with mild bronchiectasis and tree-in-bud opacities in all lobes of both lungs. Imaging features compatible with infectious/inflammatory etiology with atypical agent a distinct consideration.  Imaging follow-up recommended to ensure resolution. 2.  Aortic Atherosclerois (ICD10-170.0) Electronically Signed   By: Kennith Center M.D.   On: 05/04/2023 08:39   DG Chest 2 View Result Date: 05/04/2023 CLINICAL DATA:  Cough. EXAM: CHEST - 2 VIEW COMPARISON:  02/01/2022 FINDINGS: The lungs are clear without focal pneumonia, edema, pneumothorax or pleural effusion. Small subtle nodular density projects between the anterior right first and second ribs, new in the interval. The cardiopericardial silhouette is within normal limits for size. No acute bony abnormality. IMPRESSION: 1. No acute cardiopulmonary findings. 2. Small subtle nodular density projects between the anterior right first and second ribs, new in the interval. CT chest without contrast recommended to further evaluate. Electronically Signed   By: Kennith Center M.D.   On: 05/04/2023 08:15    Procedures Procedures    Medications Ordered in ED Medications  sodium chloride 0.9 % bolus 1,000 mL (0 mLs Intravenous Stopped 05/04/23 0943)  potassium chloride (KLOR-CON) packet 40 mEq (40 mEq Oral Given 05/04/23  1012)    ED Course/ Medical Decision Making/ A&P Clinical Course as of 05/04/23 1350  Fri May 04, 2023  0805 Echo in 2019 with normal EF per chart review. [TY]  [id number] CT Chest Wo Contrast IMPRESSION: 1. Bronchial wall thickening with mild bronchiectasis and tree-in-bud opacities in all lobes of both lungs. Imaging features compatible with infectious/inflammatory etiology with atypical agent a distinct consideration. Imaging follow-up recommended to ensure resolution. 2.  Aortic Atherosclerois (ICD10-170.0)   Electronically Signed   By: Kennith Center M.D.   On: 05/04/2023 08:3   [TY]  0901 WBC(!): 10.8 Infectious versus reactionary as patient has been on prednisone [TY]  0926 Comprehensive metabolic panel(!) Some signs of dehydration with low sodium and potassium.  Will keep potassium.  Does have an elevated BUN/creatinine consistent with her story of decreased p.o. intake.  Received IV fluids. [TY]  [id number] Patient feeling improved after IV fluids.  Discussed findings with her regarding suspected pneumonia and her elevated BUN/creatinine.  Offered admission for further IV fluids and antibiotics, but with shared decision making patient would like to try different oral antibiotics and increase her fluid intake and follow back with her primary doctor.  I feel this is not unreasonable given her normal vitals, maintaining oxygen saturation on room air.  Does not appear to be systemically ill.  Was given IV fluids here and potassium repleted.  Patient given strict return precautions.  Will discharge at patient's request. [TY]    Clinical Course User Index [TY] Coral Spikes, DO                                 Medical Decision Making This is a 73 year old female presenting emergency department with generalized malaise and cough x 3 weeks.  Per chart review has a history of hypothyroid, hypertension, CKD, hyperlipidemia, GERD.  EKG on my independent interpretation appears to be sinus rhythm  with a rate of 63 bpm.  Normal intervals, no ST segment changes to indicate ischemia.  No QTc prolongation. Unclear etiology for patient's symptoms, will get screening labs, UA and will get chest x-ray to evaluate for pneumonia given her complaint of cough.    Amount and/or Complexity of Data Reviewed Independent Historian:     Details: Husband notes patient completed prednisone taper and has a few days left of amoxicillin.   External Data Reviewed:     Details:  Per chart  review saw PCP 3/27 flu/COVID/RSV test negative at that time. Labs: ordered. Decision-making details documented in ED Course. Radiology: ordered and independent interpretation performed. Decision-making details documented in ED Course.    Details: No overt pneumonia or pneumothorax on my independent interpretation. ECG/medicine tests: ordered and independent interpretation performed.    Details: As noted above  Risk Prescription drug management. Decision regarding hospitalization.         Final Clinical Impression(s) / ED Diagnoses Final diagnoses:  Pneumonia of both lungs due to infectious organism, unspecified part of lung  Elevated serum creatinine  Hypokalemia    Rx / DC Orders ED Discharge Orders          Ordered    amoxicillin-clavulanate (AUGMENTIN) 875-125 MG tablet  Every 12 hours        05/04/23 0953    azithromycin (ZITHROMAX) 250 MG tablet  Daily        05/04/23 0953              Coral Spikes, DO 05/04/23 1350

## 2023-05-04 NOTE — ED Notes (Signed)
 Discharge paperwork given and verbally understood.

## 2023-05-04 NOTE — Discharge Instructions (Signed)
 Stop taking your amoxicillin.  Please take the antibiotics we have prescribed for the full course even if your symptoms improve.  Please try to increase your hydration with frequent sips of clears as discussed your labs showed dehydration.  Please follow-up with your primary doctor for repeat labs as soon as possible.  Return immediately felt fevers, chills, passout, chest pain, worsening shortness of breath, palpitations, inability to eat or drink due to nausea and vomiting or he develop any new or worsening symptoms that are concerning to you.

## 2023-05-04 NOTE — ED Triage Notes (Signed)
 Pt presents to ED from home C/O fatigue, ear pain, cough X 3 weeks. Reports being treated for bronchitis.

## 2023-05-13 ENCOUNTER — Emergency Department (HOSPITAL_BASED_OUTPATIENT_CLINIC_OR_DEPARTMENT_OTHER)

## 2023-05-13 ENCOUNTER — Emergency Department (HOSPITAL_BASED_OUTPATIENT_CLINIC_OR_DEPARTMENT_OTHER)
Admission: EM | Admit: 2023-05-13 | Discharge: 2023-05-13 | Disposition: A | Discharge status: Home / Self Care | Attending: Emergency Medicine | Admitting: Emergency Medicine

## 2023-05-13 ENCOUNTER — Other Ambulatory Visit: Payer: Self-pay

## 2023-05-13 ENCOUNTER — Encounter (HOSPITAL_BASED_OUTPATIENT_CLINIC_OR_DEPARTMENT_OTHER): Payer: Self-pay | Admitting: Emergency Medicine

## 2023-05-13 DIAGNOSIS — R1031 Right lower quadrant pain: Secondary | ICD-10-CM | POA: Diagnosis not present

## 2023-05-13 DIAGNOSIS — R000 Tachycardia, unspecified: Secondary | ICD-10-CM | POA: Insufficient documentation

## 2023-05-13 DIAGNOSIS — R1011 Right upper quadrant pain: Secondary | ICD-10-CM | POA: Insufficient documentation

## 2023-05-13 DIAGNOSIS — I10 Essential (primary) hypertension: Secondary | ICD-10-CM | POA: Diagnosis not present

## 2023-05-13 DIAGNOSIS — Z79899 Other long term (current) drug therapy: Secondary | ICD-10-CM | POA: Diagnosis not present

## 2023-05-13 DIAGNOSIS — E039 Hypothyroidism, unspecified: Secondary | ICD-10-CM | POA: Insufficient documentation

## 2023-05-13 LAB — COMPREHENSIVE METABOLIC PANEL WITH GFR
ALT: 10 U/L (ref 0–44)
AST: 16 U/L (ref 15–41)
Albumin: 3.5 g/dL (ref 3.5–5.0)
Alkaline Phosphatase: 63 U/L (ref 38–126)
Anion gap: 7 (ref 5–15)
BUN: 19 mg/dL (ref 8–23)
CO2: 27 mmol/L (ref 22–32)
Calcium: 9.1 mg/dL (ref 8.9–10.3)
Chloride: 99 mmol/L (ref 98–111)
Creatinine, Ser: 0.98 mg/dL (ref 0.44–1.00)
GFR, Estimated: >60 mL/min (ref >60)
Glucose, Bld: 100 mg/dL — ABNORMAL HIGH (ref 70–99)
Potassium: 3.5 mmol/L (ref 3.5–5.1)
Sodium: 133 mmol/L — ABNORMAL LOW (ref 135–145)
Total Bilirubin: 0.9 mg/dL (ref 0.0–1.2)
Total Protein: 6 g/dL — ABNORMAL LOW (ref 6.5–8.1)

## 2023-05-13 LAB — CBC
HCT: 36.3 % (ref 36.0–46.0)
Hemoglobin: 12.3 g/dL (ref 12.0–15.0)
MCH: 31.2 pg (ref 26.0–34.0)
MCHC: 33.9 g/dL (ref 30.0–36.0)
MCV: 92.1 fL (ref 80.0–100.0)
Platelets: 146 10*3/uL — ABNORMAL LOW (ref 150–400)
RBC: 3.94 MIL/uL (ref 3.87–5.11)
RDW: 13.9 % (ref 11.5–15.5)
WBC: 10.7 10*3/uL — ABNORMAL HIGH (ref 4.0–10.5)
nRBC: 0 % (ref 0.0–0.2)

## 2023-05-13 LAB — URINALYSIS, ROUTINE W REFLEX MICROSCOPIC
Bacteria, UA: NONE SEEN
Bilirubin Urine: NEGATIVE
Glucose, UA: NEGATIVE mg/dL
Ketones, ur: NEGATIVE mg/dL
Leukocytes,Ua: NEGATIVE
Nitrite: NEGATIVE
Specific Gravity, Urine: 1.019 (ref 1.005–1.030)
pH: 6 (ref 5.0–8.0)

## 2023-05-13 LAB — LIPASE, BLOOD: Lipase: 11 U/L (ref 11–51)

## 2023-05-13 MED ORDER — ACETAMINOPHEN 500 MG PO TABS
1000.0000 mg | ORAL_TABLET | Freq: Once | ORAL | Status: AC
  Administered 2023-05-13: 1000 mg via ORAL
  Filled 2023-05-13: qty 2

## 2023-05-13 MED ORDER — ONDANSETRON HCL 4 MG/2ML IJ SOLN
4.0000 mg | Freq: Once | INTRAMUSCULAR | Status: AC
  Administered 2023-05-13: 4 mg via INTRAVENOUS
  Filled 2023-05-13: qty 2

## 2023-05-13 MED ORDER — IOHEXOL 300 MG/ML  SOLN
100.0000 mL | Freq: Once | INTRAMUSCULAR | Status: AC | PRN
  Administered 2023-05-13: 100 mL via INTRAVENOUS

## 2023-05-13 MED ORDER — MORPHINE SULFATE (PF) 2 MG/ML IV SOLN
2.0000 mg | Freq: Once | INTRAVENOUS | Status: AC
  Administered 2023-05-13: 2 mg via INTRAVENOUS
  Filled 2023-05-13: qty 1

## 2023-05-13 MED ORDER — KETOROLAC TROMETHAMINE 15 MG/ML IJ SOLN
15.0000 mg | Freq: Once | INTRAMUSCULAR | Status: AC
  Administered 2023-05-13: 15 mg via INTRAVENOUS
  Filled 2023-05-13: qty 1

## 2023-05-13 MED ORDER — DICYCLOMINE HCL 10 MG PO CAPS
10.0000 mg | ORAL_CAPSULE | Freq: Once | ORAL | Status: AC
  Administered 2023-05-13: 10 mg via ORAL
  Filled 2023-05-13: qty 1

## 2023-05-13 MED ORDER — LACTATED RINGERS IV BOLUS
1000.0000 mL | Freq: Once | INTRAVENOUS | Status: AC
  Administered 2023-05-13: 1000 mL via INTRAVENOUS

## 2023-05-13 NOTE — Discharge Instructions (Signed)
 Thank you for coming to [facility] Emergency Department. You were seen for abdominal pain. We did an exam, labs, and imaging, and these showed no acute findings. You can take tylenol 1,000 mg every 8 hours at home for pain. Please follow up with your primary care provider within 1 week.   Do not hesitate to return to the ED or call 911 if you experience: -Worsening symptoms -Lightheadedness, passing out -Fevers/chills -Anything else that concerns you

## 2023-05-13 NOTE — ED Provider Notes (Signed)
 3:17 PM Assumed care of patient from off-going team. For more details, please see note from same day.  In brief, this is a 73 y.o. female with right-sided abdominal pain. Has h/o abdominal pain and is following up with GI. This is different though. +nausea, no vomiting, no prior abd surgeries. Labs okay.   Plan/Dispo at time of sign-out & ED Course since sign-out: [ ]  CT  BP 119/73   Pulse 95   Temp 98.5 F (36.9 C) (Oral)   Resp 20   Wt 64.9 kg   SpO2 100%   BMI 24.55 kg/m    ED Course:   Clinical Course as of 05/13/23 2311  Sun May 13, 2023  1554 CT ABDOMEN PELVIS W CONTRAST 1. No acute findings in the abdomen pelvis. 2. Appendix not identified. No secondary signs of appendicitis. 3. Small hiatal hernia.   [HN]    Clinical Course User Index [HN] Merdis Stalling, MD   RUQ US  negative as well. Patient's pain improved with small dose of morphine, tylenol, toradol, bentyl. No emergent etiology noted. Suspect likely chronic abdominal pain component. Advised f/u with GI. Patient overall well-appearing and tolerating PO. DC w/ discharge instructions/return precautions. All questions answered to patient's satisfaction.    ------------------------------- Annita Kindle, MD Emergency Medicine  This note was created using dictation software, which may contain spelling or grammatical errors.   Merdis Stalling, MD 05/13/23 [id number]

## 2023-05-13 NOTE — ED Triage Notes (Signed)
 Right flank pain, suddenly this am, lower abdomen. Started with chills also

## 2023-05-13 NOTE — ED Provider Notes (Signed)
 Karen Horne Provider Note   CSN: [id number] Arrival date & time: 05/13/23  1339     History  No chief complaint on file.   Karen Horne is a 73 y.o. female.  This is a 73 year old female with a history of hypertension, IBS, GERD, hypothyroidism with no past abdominal surgeries other than for reflux who is presenting today with abdominal pain that started around 3 AM this morning and has been severe.  It has been on the right side and is not improving.  She has nausea but denies any vomiting.  No diarrhea.  She has not had any bowel movements today.  She denies any dysuria, frequency or urgency.  She does report that for a while now she has been having some mild intermittent epigastric pain but this pain feels different.  She does have an appointment to follow-up with GI Dr. But when the pain did not subside today she came for evaluation.  He has not taken anything at home for the pain.  She denies any respiratory or chest symptoms.  The history is provided by the patient.       Home Medications Prior to Admission medications   Medication Sig Start Date End Date Taking? Authorizing Provider  azithromycin (ZITHROMAX) 250 MG tablet Take 1 tablet (250 mg total) by mouth daily. Take first 2 tablets together, then 1 every day until finished. 05/04/23   Rolinda Climes, DO  cyanocobalamin 1000 MCG tablet Take 1,000 mcg by mouth daily.    [provider]  dicyclomine (BENTYL) 10 MG capsule TAKE 1 CAPSULE (10 MG TOTAL) BY MOUTH 2 (TWO) TIMES DAILY AS NEEDED FOR SPASMS. 08/22/22   Lajuan Pila, MD  fluticasone [facility]) 50 MCG/ACT nasal spray Place 1 spray into both nostrils daily.    [provider]  levothyroxine (SYNTHROID, LEVOTHROID) 75 MCG tablet Take 75 mcg by mouth daily before breakfast.    [provider]  lisinopril-hydrochlorothiazide (ZESTORETIC) 10-12.5 MG tablet Take 1 tablet by mouth daily. 04/23/20   [provider]  omeprazole (PRILOSEC) 40 MG capsule Take 40 mg by mouth daily. 09/19/22   [provider]  rosuvastatin (CRESTOR) 5 MG tablet Take 5 mg by mouth daily. 08/11/22   [provider]  traZODone (DESYREL) 100 MG tablet Take 100 mg by mouth at bedtime. 08/18/22   [provider]  venlafaxine XR (EFFEXOR-XR) 75 MG 24 hr capsule Take 75 mg by mouth at bedtime.    [provider]      Allergies    Adhesive [tape]    Review of Systems   Review of Systems  Physical Exam Updated Vital Signs BP 119/73   Pulse 95   Temp 98.5 F (36.9 C) (Oral)   Resp 20   Wt 64.9 kg   SpO2 100%   BMI 24.55 kg/m  Physical Exam Vitals and nursing note reviewed.  Constitutional:      General: She is not in acute distress.    Appearance: She is well-developed.  HENT:     Head: Normocephalic and atraumatic.     Mouth/Throat:     Mouth: Mucous membranes are dry.  Eyes:     Pupils: Pupils are equal, round, and reactive to light.  Cardiovascular:     Rate and Rhythm: Regular rhythm. Tachycardia present.     Heart sounds: Normal heart sounds. No murmur heard.    No friction rub.  Pulmonary:     Effort: Pulmonary  effort is normal.     Breath sounds: Normal breath sounds. No wheezing or rales.  Abdominal:     General: Bowel sounds are normal. There is no distension.     Palpations: Abdomen is soft.     Tenderness: There is abdominal tenderness in the right upper quadrant and right lower quadrant. There is guarding. There is no right CVA tenderness, left CVA tenderness or rebound.     Comments: Diffuse generalized tenderness but guarding in the right upper quadrant and left lower quadrant  Musculoskeletal:        General: No tenderness. Normal range of motion.     Right lower leg: No edema.     Left lower leg: No edema.     Comments: No edema  Skin:    General: Skin is warm and dry.     Findings: No rash.  Neurological:     Mental Status: She is alert and  oriented to person, place, and time.     Cranial Nerves: No cranial nerve deficit.  Psychiatric:        Behavior: Behavior normal.     ED Results / Procedures / Treatments   Labs (all labs ordered are listed, but only abnormal results are displayed) Labs Reviewed  URINALYSIS, ROUTINE W REFLEX MICROSCOPIC - Abnormal; Notable for the following components:      Result Value   Hgb urine dipstick TRACE (*)    Protein, ur TRACE (*)    All other components within normal limits  LIPASE, BLOOD  COMPREHENSIVE METABOLIC PANEL WITH GFR  CBC    EKG None  Radiology No results found.  Procedures Procedures    Medications Ordered in ED Medications  ondansetron (ZOFRAN) injection 4 mg (has no administration in time range)  lactated ringers bolus 1,000 mL (has no administration in time range)    ED Course/ Medical Decision Making/ A&P                                 Medical Decision Making Amount and/or Complexity of Data Reviewed Labs: ordered. Radiology: ordered.  Risk Prescription drug management.   Pt with multiple medical problems and comorbidities and presenting today with a complaint that caries a high risk for morbidity and mortality.  Here today with complaint of abdominal pain and nausea.  Concern for cholecystitis, hepatitis, appendicitis, perforation, kidney stone.  Low suspicion for diverticulitis, mesenteric ischemia, pyelonephritis or UTI.  Patient refusing pain medication at this time but was given IV fluids and antiemetics.  Labs and imaging are pending.         Final Clinical Impression(s) / ED Diagnoses Final diagnoses:  None    Rx / DC Orders ED Discharge Orders     None         Almond Army, MD 05/13/23 1457

## 2023-05-23 ENCOUNTER — Ambulatory Visit: Payer: Medicare Other | Admitting: Gastroenterology

## 2023-05-29 NOTE — Progress Notes (Unsigned)
 05/30/2023 Karen Horne [id number] 25-Aug-1950  Referring provider: Lorella Roles, MD Primary GI doctor: Dr. Venice Gillis ( Dr. Adan Holms)  ASSESSMENT AND PLAN:   IBS with diarrhea/AB pain Colonoscopy unremarkable 03/2022 negative microscopic colitis 05/13/2023 CT abdomen pelvis with contrast for right lower quadrant abdominal pain showed no acute findings, small hiatal hernia unremarkable Continues to have diarrhea/incontinence/urgency worse after food once a week, no identified trigger, can have days without a BM but mainly has formed BM 2 x a day  can have mucus no hematochezia Worse since infection in [place] On Bentyl  10 mg as needed - get KUB to evaluate stool burden - possible SIBO, will treat with flagyl 250mg  TID for 10 days, can retreat if needed - add on fiber, given FoDMAP - consider anal rectal manometry/pelvic floor PT  GERD with history of hiatal hernia status fundoplication with epigastric discomfort, clears throat at night, some dysphagia since fundoplication with occ regurg, some early satiety, bloating RUQ AB pain, pin point tenderness, worse in the morning with movement Negative murphy 2009 EGD unremarkable Ultrasound 12/2021 unremarkable, 05/13/2023 RUQ Q US  negative On omeprazole 40 mg daily - EGD to evaluate with Dr. Venice Gillis, I discussed risks of EGD with patient today, including risk of sedation, bleeding or perforation.  Patient provides understanding and gave verbal consent to proceed. - likely component of chronic abdominal wall pain with pin point tenderness and + carrnett on exam, do salon pas patches, consider injection in the office if worsens or does not improve - consider UGI/barium if EGD negative, no signs of aspiration - gastroparesis diet given -Consider HIDA/GES  Fatty liver Seen on ultrasound 12/2021 Normal liver function    Latest Ref Rng & Units 05/13/2023    2:28 PM 05/04/2023    8:20 AM 05/13/2022    5:15 PM  Hepatic Function  Total  Protein 6.5 - 8.1 g/dL 6.0  7.0  6.7   Albumin 3.5 - 5.0 g/dL 3.5  4.0  4.2   AST 15 - 41 U/L 16  16  18    ALT 0 - 44 U/L 10  17  17    Alk Phosphatase 38 - 126 U/L 63  68  89   Total Bilirubin 0.0 - 1.2 mg/dL 0.9  0.6  0.8    Bilateral pneumonia 05/04/23 Treated with Z-Pak and Augmentin  Leukocytosis with thrombocytopenia, low protein 10 days afterwards. Unremarkable liver Recheck labs CBC c-Met for leukocytosis and thrombocytopenia potentially related to infection  Patient Care Team: Lorella Roles, MD as PCP - General (Family Medicine)  HISTORY OF PRESENT ILLNESS: 73 y.o. female with a past medical history of hypertension, IBS, hemorrhoids, GERD with history of hiatal hernia and previous fundoplication surgery, fatty liver disease, hypothyroidism, B12 deficiency and others listed below presents for follow-up of right upper quadrant pain and hemorrhoids last seen in the office 09/19/2022 by Dr. Venice Gillis  Discussed the use of AI scribe software for clinical note transcription with the patient, who gave verbal consent to proceed.  History of Present Illness   Karen Horne is a 73 year old female who presents with abdominal pain and diarrhea.  She experiences abdominal pain that she can localize with her finger, describing it as feeling 'blowed up like a balloon.' The pain is worse in the mornings and is associated with bloating. It is consistent but intensifies upon waking and moving around. No reflux symptoms have been noted since her fundoplication surgery over 20 years ago.  She experiences diarrhea with urgency  and fecal incontinence, occurring at least once a week. The diarrhea is described as large volume with mucus, but no blood or dark stools. The episodes are unpredictable, sometimes triggered by eating, but without a consistent dietary trigger. Normal bowel movements occur on other days, typically twice daily, with formed stools and no straining. A colonoscopy in 2024 for diarrhea  was negative, and a recent CT scan on May 13, 2023, for right lower abdominal pain showed a small hiatal hernia but was otherwise unremarkable. An ultrasound of the gallbladder on the same day was also unremarkable.  She continues to take omeprazole 40 mg, although it does not alleviate her current symptoms. She reports a history of food getting caught in her esophagus since her fundoplication, particularly with biscuits or pasta, sometimes requiring her to induce vomiting. This has not worsened over time.  She mentions a significant weight gain of 20 pounds over the last year despite not gaining weight for years prior. She feels full easily and experiences bloating, which she describes as feeling like her stomach is 'blown up.' She denies nausea or vomiting but notes difficulty breathing when feeling bloated.  She recalls a trip to [place] a few years ago where she experienced persistent diarrhea, which she feels has continued since then. She also reports throat clearing for two hours before sleep, which she attributes to a different issue.        She  reports that she has never smoked. She has never used smokeless tobacco. She reports that she does not drink alcohol and does not use drugs.  Current Medications:   Current Outpatient Medications (Endocrine & Metabolic):    levothyroxine  (SYNTHROID , LEVOTHROID) 75 MCG tablet, Take 75 mcg by mouth daily before breakfast.  Current Outpatient Medications (Cardiovascular):    ezetimibe (ZETIA) 10 MG tablet, Take 1 tablet by mouth daily.   lisinopril -hydrochlorothiazide  (ZESTORETIC ) 10-12.5 MG tablet, Take 1 tablet by mouth daily.  Current Outpatient Medications (Respiratory):    fluticasone (FLONASE) 50 MCG/ACT nasal spray, Place 1 spray into both nostrils daily.   Current Outpatient Medications (Hematological):    cyanocobalamin 1000 MCG tablet, Take 1,000 mcg by mouth daily.  Current Outpatient Medications (Other):    dicyclomine   (BENTYL ) 10 MG capsule, TAKE 1 CAPSULE (10 MG TOTAL) BY MOUTH 2 (TWO) TIMES DAILY AS NEEDED FOR SPASMS.   escitalopram (LEXAPRO) 10 MG tablet, Take 1 tablet by mouth daily.   metroNIDAZOLE (FLAGYL) 250 MG tablet, Take 1 tablet (250 mg total) by mouth 3 (three) times daily for 10 days.   omeprazole (PRILOSEC) 40 MG capsule, Take 40 mg by mouth daily.  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 Family History:  Her family history includes High blood pressure in her father, maternal grandfather, maternal grandmother, mother, paternal grandfather, and paternal grandmother.  REVIEW OF SYSTEMS  : All other systems reviewed and negative except where noted in the History of Present Illness.  PHYSICAL EXAM: BP 120/70 (BP Location: Left Arm, Patient Position: Sitting, Cuff Size: Normal)   Pulse 74   Ht 5\' 4"  (1.626 m)   Wt 160 lb 8 oz (72.8 kg)   BMI 27.55 kg/m  General:   Pleasant, well developed female in no acute distress Head:   Normocephalic and atraumatic. Eyes:  sclerae anicteric,conjunctive pink  Heart:   regular rate and rhythm Pulm:  Clear anteriorly; no wheezing Abdomen:   Soft, Non-distended AB, Active bowel sounds. No tenderness ., No organomegaly appreciated. Rectal: Normal external rectal exam, normal rectal tone, no internal hemorrhoids appreciated, no masses, non tender, brown stool, hemoccult Negative Extremities:  Without edema. Msk: Symmetrical without gross  deformities. Peripheral pulses intact.  Neurologic:  Alert and  oriented x4;  No focal deficits.  Skin:   Dry and intact without significant lesions or rashes. Psychiatric:  Cooperative. Normal mood and affect.  RELEVANT LABS AND IMAGING: CBC    Component Value Date/Time   WBC 10.7 (H) 05/13/2023 1428   RBC 3.94 05/13/2023 1428   HGB 12.3 05/13/2023 1428   HCT 36.3 05/13/2023 1428   PLT 146 (L) 05/13/2023 1428   MCV 92.1 05/13/2023 1428   MCH 31.2 05/13/2023 1428   MCHC 33.9 05/13/2023 1428   RDW 13.9 05/13/2023 1428   LYMPHSABS 1.3 05/13/2022 1715   MONOABS 0.4 05/13/2022 1715   EOSABS 0.0 05/13/2022 1715   BASOSABS 0.0 05/13/2022 1715    CMP     Component Value Date/Time   NA 133 (L) 05/13/2023 1428   K 3.5 05/13/2023 1428   CL 99 05/13/2023 1428   CO2 27 05/13/2023 1428   GLUCOSE 100 (H) 05/13/2023 1428   BUN 19 05/13/2023 1428   CREATININE 0.98 05/13/2023 1428   CALCIUM  9.1 05/13/2023 1428   PROT 6.0 (L) 05/13/2023 1428   ALBUMIN 3.5 05/13/2023 1428   AST 16 05/13/2023 1428   ALT 10 05/13/2023 1428   ALKPHOS 63 05/13/2023 1428   BILITOT 0.9 05/13/2023 1428   GFRNONAA >60 05/13/2023 1428   GFRAA >60 02/26/2017 0524     Edmonia Gottron, PA-C 10:22 AM

## 2023-05-30 ENCOUNTER — Ambulatory Visit: Admitting: Physician Assistant

## 2023-05-30 ENCOUNTER — Ambulatory Visit
Admission: RE | Admit: 2023-05-30 | Discharge: 2023-05-30 | Disposition: A | Discharge status: Home / Self Care | Source: Ambulatory Visit | Attending: Physician Assistant | Admitting: Physician Assistant

## 2023-05-30 ENCOUNTER — Encounter: Payer: Self-pay | Admitting: Physician Assistant

## 2023-05-30 ENCOUNTER — Other Ambulatory Visit (INDEPENDENT_AMBULATORY_CARE_PROVIDER_SITE_OTHER)

## 2023-05-30 VITALS — BP 120/70 | HR 74 | Ht 64.0 in | Wt 160.5 lb

## 2023-05-30 DIAGNOSIS — D696 Thrombocytopenia, unspecified: Secondary | ICD-10-CM

## 2023-05-30 DIAGNOSIS — R140 Abdominal distension (gaseous): Secondary | ICD-10-CM

## 2023-05-30 DIAGNOSIS — R1013 Epigastric pain: Secondary | ICD-10-CM

## 2023-05-30 DIAGNOSIS — R6881 Early satiety: Secondary | ICD-10-CM

## 2023-05-30 DIAGNOSIS — R0989 Other specified symptoms and signs involving the circulatory and respiratory systems: Secondary | ICD-10-CM | POA: Diagnosis not present

## 2023-05-30 DIAGNOSIS — K760 Fatty (change of) liver, not elsewhere classified: Secondary | ICD-10-CM

## 2023-05-30 DIAGNOSIS — K580 Irritable bowel syndrome with diarrhea: Secondary | ICD-10-CM

## 2023-05-30 DIAGNOSIS — K449 Diaphragmatic hernia without obstruction or gangrene: Secondary | ICD-10-CM

## 2023-05-30 DIAGNOSIS — R1319 Other dysphagia: Secondary | ICD-10-CM

## 2023-05-30 DIAGNOSIS — K219 Gastro-esophageal reflux disease without esophagitis: Secondary | ICD-10-CM | POA: Diagnosis not present

## 2023-05-30 DIAGNOSIS — R1011 Right upper quadrant pain: Secondary | ICD-10-CM | POA: Diagnosis not present

## 2023-05-30 DIAGNOSIS — R1310 Dysphagia, unspecified: Secondary | ICD-10-CM

## 2023-05-30 DIAGNOSIS — D72829 Elevated white blood cell count, unspecified: Secondary | ICD-10-CM

## 2023-05-30 LAB — CBC WITH DIFFERENTIAL/PLATELET
Basophils Absolute: 0 10*3/uL (ref 0.0–0.1)
Basophils Relative: 0.6 % (ref 0.0–3.0)
Eosinophils Absolute: 0.1 10*3/uL (ref 0.0–0.7)
Eosinophils Relative: 1.5 % (ref 0.0–5.0)
HCT: 35.7 % — ABNORMAL LOW (ref 36.0–46.0)
Hemoglobin: 11.8 g/dL — ABNORMAL LOW (ref 12.0–15.0)
Lymphocytes Relative: 32.2 % (ref 12.0–46.0)
Lymphs Abs: 1.5 10*3/uL (ref 0.7–4.0)
MCHC: 33.1 g/dL (ref 30.0–36.0)
MCV: 94.4 fl (ref 78.0–100.0)
Monocytes Absolute: 0.4 10*3/uL (ref 0.1–1.0)
Monocytes Relative: 7.8 % (ref 3.0–12.0)
Neutro Abs: 2.8 10*3/uL (ref 1.4–7.7)
Neutrophils Relative %: 57.9 % (ref 43.0–77.0)
Platelets: 260 10*3/uL (ref 150.0–400.0)
RBC: 3.78 Mil/uL — ABNORMAL LOW (ref 3.87–5.11)
RDW: 14.7 % (ref 11.5–15.5)
WBC: 4.8 10*3/uL (ref 4.0–10.5)

## 2023-05-30 LAB — COMPREHENSIVE METABOLIC PANEL WITH GFR
ALT: 11 U/L (ref 0–35)
AST: 17 U/L (ref 0–37)
Albumin: 3.9 g/dL (ref 3.5–5.2)
Alkaline Phosphatase: 79 U/L (ref 39–117)
BUN: 21 mg/dL (ref 6–23)
CO2: 28 meq/L (ref 19–32)
Calcium: 9.4 mg/dL (ref 8.4–10.5)
Chloride: 102 meq/L (ref 96–112)
Creatinine, Ser: 1.03 mg/dL (ref 0.40–1.20)
GFR: 53.81 mL/min — ABNORMAL LOW (ref >60.00)
Glucose, Bld: 83 mg/dL (ref 70–99)
Potassium: 4.9 meq/L (ref 3.5–5.1)
Sodium: 137 meq/L (ref 135–145)
Total Bilirubin: 0.5 mg/dL (ref 0.2–1.2)
Total Protein: 6.3 g/dL (ref 6.0–8.3)

## 2023-05-30 LAB — TSH: TSH: 1.88 u[IU]/mL (ref 0.35–5.50)

## 2023-05-30 LAB — SEDIMENTATION RATE: Sed Rate: 27 mm/h (ref 0–30)

## 2023-05-30 MED ORDER — METRONIDAZOLE 250 MG PO TABS: 250.0000 mg | ORAL_TABLET | Freq: Three times a day (TID) | ORAL | 0 refills | Status: AC

## 2023-05-30 NOTE — Patient Instructions (Addendum)
 _______________________________________________________  If your blood pressure at your visit was 140/90 or greater, please contact your primary care physician to follow up on this.  _______________________________________________________  If you are age 74 or older, your body mass index should be between 23-30. Your Body mass index is 27.55 kg/m. If this is out of the aforementioned range listed, please consider follow up with your Primary Care Provider.  If you are age 59 or younger, your body mass index should be between 19-25. Your Body mass index is 27.55 kg/m. If this is out of the aformentioned range listed, please consider follow up with your Primary Care Provider.   ________________________________________________________  The [place] providers would like to encourage you to use MYCHART to communicate with providers for non-urgent requests or questions.  Due to long hold times on the telephone, sending your provider a message by [facility] may be a faster and more efficient way to get a response.  Please allow 48 business hours for a response.  Please remember that this is for non-urgent requests.  _______________________________________________________  Your provider has requested that you go to the basement level for lab work before leaving today. Press "B" on the elevator. The lab is located at the first door on the left as you exit the elevator.  Your provider has requested that you have an abdominal x ray before leaving today. Please go to the basement floor to our Radiology department for the test.   You have been scheduled for an endoscopy. Please follow written instructions given to you at your visit today.  If you use inhalers (even only as needed), please bring them with you on the day of your procedure.  If you take any of the following medications, they will need to be adjusted prior to your procedure:   DO NOT TAKE 7 DAYS PRIOR TO TEST- Trulicity  (dulaglutide) Ozempic, Wegovy (semaglutide) Mounjaro (tirzepatide) Bydureon Bcise (exanatide extended release)  DO NOT TAKE 1 DAY PRIOR TO YOUR TEST Rybelsus (semaglutide) Adlyxin (lixisenatide) Victoza (liraglutide) Byetta (exanatide) ___________________________________________________________________________    VISIT SUMMARY:  During your visit, we discussed your ongoing abdominal pain, diarrhea, and other related symptoms. We reviewed your recent medical history, including past surgeries and recent imaging results. We have developed a plan to address your symptoms and improve your overall health.  YOUR PLAN:  -ABDOMINAL PAIN WITH POSITIVE CARNETT'S SIGN: Your abdominal pain may be due to nerve entrapment, which is when a nerve gets compressed or irritated. We recommend using Salonpas patches (which contain lidocaine) for pain relief. If these patches help, we may consider further treatments like abdominal injections.  -POSSIBLE SMALL INTESTINAL BACTERIAL OVERGROWTH (SIBO): Your symptoms suggest you might have SIBO, a condition where too many bacteria grow in the small intestine. We will start treatment for SIBO with appropriate medication to see if it helps.  Small intestinal bacterial overgrowth (SIBO) occurs when there is an abnormal increase in the overall bacterial population in the small intestine -- particularly types of bacteria not commonly found in that part of the digestive tract. Small intestinal bacterial overgrowth (SIBO) commonly results when a circumstance -- such as surgery or disease -- slows the passage of food and waste products in the digestive tract, creating a breeding ground for bacteria.  Signs and symptoms of SIBO often include: Loss of appetite Abdominal pain Nausea Bloating An uncomfortable feeling of fullness after eating Diarrhea or constipation, depending on the type of gas produced  What foods trigger SIBO? While foods aren't the original  cause of  SIBO, certain foods do encourage the overgrowth of the wrong bacteria in your small intestine. If you're feeding them their favorite foods, they're going to grow more, and that will trigger more of your SIBO symptoms. By the same token, you can help reduce the overgrowth by starving the problematic bacteria of their favorite foods. This strategy has led to a number of proposed SIBO eating plans. The plans vary, and so do individual results. But in general, they tend to recommend limiting carbohydrates.  These include: Sugars and sweeteners. Fruits and starchy vegetables. Dairy products. Grains.  There is a test for this we can do called a breath test, if you are positive we will treat you with an antibiotic to see if it helps.  Your symptoms are very suspicious for this condition, as discussed, we will start you on an antibiotic to see if this helps.    -FECAL INCONTINENCE AND DIARRHEA: You are experiencing diarrhea and fecal incontinence, which means you have difficulty controlling your bowel movements. This might be related to an infection you had in the past. We will monitor your symptoms and adjust your treatment as needed. - please add on fiber supplement  FIBER SUPPLEMENT You can do metamucil or fibercon once or twice a day but if this causes gas/bloating please switch to Benefiber or Citracel.  Fiber is good for constipation/diarrhea/irritable bowel syndrome.  It can also help with weight loss and can help lower your bad cholesterol (LDL).  Please do 1 TBSP in the morning in water, coffee, or tea.  It can take up to a month before you can see a difference with your bowel movements.  It is cheapest from [employer], [employer], [employer].    -DYSPHAGIA POST-FUNDOPLICATION: You have difficulty swallowing (dysphagia) and sometimes food gets stuck, which is likely related to your past fundoplication surgery. We will schedule an upper endoscopy to check for any structural issues or complications from  the surgery. If needed, we might also consider other tests like a barium swallow or upper GI series.  INSTRUCTIONS:  Please schedule an endoscopy with Dr. Venice Gillis. We will also recheck your labs to ensure that any abnormalities noted during your recent hospitalization have resolved.

## 2023-06-03 NOTE — Progress Notes (Signed)
 Agree with assessment/plan.  Edman Circle, MD [facility] [phone]

## 2023-06-12 ENCOUNTER — Ambulatory Visit: Payer: Self-pay | Admitting: Physician Assistant

## 2023-06-16 ENCOUNTER — Emergency Department (HOSPITAL_BASED_OUTPATIENT_CLINIC_OR_DEPARTMENT_OTHER): Admitting: Radiology

## 2023-06-16 ENCOUNTER — Other Ambulatory Visit: Payer: Self-pay

## 2023-06-16 ENCOUNTER — Emergency Department (HOSPITAL_BASED_OUTPATIENT_CLINIC_OR_DEPARTMENT_OTHER)
Admission: EM | Admit: 2023-06-16 | Discharge: 2023-06-16 | Disposition: A | Discharge status: Home / Self Care | Attending: Emergency Medicine | Admitting: Emergency Medicine

## 2023-06-16 DIAGNOSIS — Y9289 Other specified places as the place of occurrence of the external cause: Secondary | ICD-10-CM | POA: Diagnosis not present

## 2023-06-16 DIAGNOSIS — S52502A Unspecified fracture of the lower end of left radius, initial encounter for closed fracture: Secondary | ICD-10-CM

## 2023-06-16 DIAGNOSIS — W010XXA Fall on same level from slipping, tripping and stumbling without subsequent striking against object, initial encounter: Secondary | ICD-10-CM | POA: Diagnosis not present

## 2023-06-16 DIAGNOSIS — S52592A Other fractures of lower end of left radius, initial encounter for closed fracture: Secondary | ICD-10-CM | POA: Diagnosis not present

## 2023-06-16 DIAGNOSIS — M25532 Pain in left wrist: Secondary | ICD-10-CM | POA: Diagnosis present

## 2023-06-16 MED ORDER — ACETAMINOPHEN 500 MG PO TABS
1000.0000 mg | ORAL_TABLET | Freq: Once | ORAL | Status: AC
  Administered 2023-06-16: 1000 mg via ORAL
  Filled 2023-06-16: qty 2

## 2023-06-16 MED ORDER — OXYCODONE-ACETAMINOPHEN 5-325 MG PO TABS: 1.0000 | ORAL_TABLET | Freq: Once | ORAL | Status: DC

## 2023-06-16 MED ORDER — OXYCODONE HCL 5 MG PO TABS
5.0000 mg | ORAL_TABLET | Freq: Once | ORAL | Status: AC
  Administered 2023-06-16: 5 mg via ORAL
  Filled 2023-06-16: qty 1

## 2023-06-16 MED ORDER — IBUPROFEN 400 MG PO TABS
400.0000 mg | ORAL_TABLET | Freq: Once | ORAL | Status: AC
  Administered 2023-06-16: 400 mg via ORAL
  Filled 2023-06-16: qty 1

## 2023-06-16 MED ORDER — OXYCODONE HCL 5 MG PO TABS: 5.0000 mg | ORAL_TABLET | ORAL | 0 refills | Status: DC | PRN

## 2023-06-16 NOTE — ED Provider Notes (Signed)
 [place] EMERGENCY DEPARTMENT AT [facility] Provider Note   CSN: [id number] Arrival date & time: 06/16/23  1920     History  Chief Complaint  Patient presents with   Karen Horne is a 73 y.o. female.  This is a sprightly 73 year old female who presents to the emergency room today after she had a fall.  Patient was backing up in the garage, her heel caught a ledge and she fell backwards landing on her butt.  She did not strike her head.  She did not lose consciousness.  She broke her fall with her left hand.   Fall       Home Medications Prior to Admission medications   Medication Sig Start Date End Date Taking? Authorizing Provider  cyanocobalamin 1000 MCG tablet Take 1,000 mcg by mouth daily.    [provider]  dicyclomine  (BENTYL ) 10 MG capsule TAKE 1 CAPSULE (10 MG TOTAL) BY MOUTH 2 (TWO) TIMES DAILY AS NEEDED FOR SPASMS. 08/22/22   Lajuan Pila, MD  escitalopram (LEXAPRO) 10 MG tablet Take 1 tablet by mouth daily. 03/31/23   [provider]  ezetimibe (ZETIA) 10 MG tablet Take 1 tablet by mouth daily. 03/31/23   [provider]  fluticasone (FLONASE) 50 MCG/ACT nasal spray Place 1 spray into both nostrils daily.    [provider]  levothyroxine  (SYNTHROID , LEVOTHROID) 75 MCG tablet Take 75 mcg by mouth daily before breakfast.    [provider]  lisinopril -hydrochlorothiazide  (ZESTORETIC ) 10-12.5 MG tablet Take 1 tablet by mouth daily. 04/23/20   [provider]  omeprazole (PRILOSEC) 40 MG capsule Take 40 mg by mouth daily. 09/19/22   [provider]      Allergies    Adhesive [tape]    Review of Systems   Review of Systems  Physical Exam Updated Vital Signs BP 117/68 (BP Location: Right Arm)   Pulse 76   Temp 98.2 F (36.8 C) (Oral)   Resp 16   Ht 5\' 4"  (1.626 m)   Wt 68 kg   SpO2 97%   BMI 25.75 kg/m  Physical Exam Vitals reviewed.  HENT:     Head: Normocephalic and  atraumatic.  Cardiovascular:     Rate and Rhythm: Normal rate.     Pulses: Normal pulses.  Musculoskeletal:     Cervical back: Normal range of motion. No rigidity.     Comments:  No tenderness to palpation in the chest.  Pelvis stable, nontender.  No tenderness, deformities noted on bilateral upper legs, knees, lower legs or ankles.  Patient able to lift both legs from the bed.  Patient with bruising, hematoma at the left distal radius.  Lymphadenopathy:     Cervical: No cervical adenopathy.  Neurological:     General: No focal deficit present.     Mental Status: She is alert.     Comments: Median, radial, ulnar nerve sensation and function of the left wrist and hand.  Normal gait.  A and O x 3.  Cranial nerves II through XII intact.     ED Results / Procedures / Treatments   Labs (all labs ordered are listed, but only abnormal results are displayed) Labs Reviewed - No data to display  EKG None  Radiology DG Sacrum/Coccyx Result Date: 06/16/2023 CLINICAL DATA:  Fall EXAM: SACRUM AND COCCYX - 2+ VIEW COMPARISON:  CT abdomen and pelvis 05/13/2023 FINDINGS: There is no acute fracture or dislocation identified. There is stable mild compression deformity  of L4. The bones are osteopenic. There are mild degenerative changes of the sacroiliac joints. IMPRESSION: 1. No acute fracture or dislocation identified. 2. Stable mild compression deformity of L4. 3. Osteopenia. Electronically Signed   By: Tyron Gallon M.D.   On: 06/16/2023 21:12   DG Forearm Left Result Date: 06/16/2023 CLINICAL DATA:  Fall, left wrist pain EXAM: LEFT FOREARM - 2 VIEW COMPARISON:  None Available. FINDINGS: There is essentially a nondisplaced distal left radial fracture. No ulnar abnormality. No joint effusion within the left elbow. Soft tissues are intact. No subluxation or dislocation. IMPRESSION: Distal left radial fracture.  No additional acute bony abnormality. Electronically Signed   By: Janeece Mechanic M.D.   On:  06/16/2023 21:10   DG Wrist Complete Left Result Date: 06/16/2023 CLINICAL DATA:  Fall, left wrist pain EXAM: LEFT WRIST - COMPLETE 3+ VIEW COMPARISON:  None Available. FINDINGS: There is a fracture through the distal left radius with intra-articular extension. No significant displacement. No subluxation or dislocation. No ulnar abnormality. IMPRESSION: Nondisplaced intra-articular distal left radial fracture. Electronically Signed   By: Janeece Mechanic M.D.   On: 06/16/2023 21:10    Procedures Procedures    Medications Ordered in ED Medications  oxyCODONE  (Oxy IR/ROXICODONE ) immediate release tablet 5 mg (has no administration in time range)  acetaminophen  (TYLENOL ) tablet 1,000 mg (1,000 mg Oral Given 06/16/23 2106)  ibuprofen (ADVIL) tablet 400 mg (400 mg Oral Given 06/16/23 2106)    ED Course/ Medical Decision Making/ A&P                                 Medical Decision Making 73 year old female who is here today after she had a nonsyncopal fall from standing.  Plan- patient not on any blood thinners.  Normal vital signs.  Was in her usual state of health prior to this event.  Based on the mechanism of injury, her reassuring exam, do not believe she requires imaging of the head or neck.  No neurological deficits on exam.  Obtain plain films of the patient's left wrist which do show a distal radius fracture, good alignment.  Placed patient in a radial gutter splint.  Will have her follow-up with orthopedic surgery.  Will send prescription for narcotics for the patient.  I considered labs on this patient, however do not believe they would change management.  Reviewed the patient's medication list.  Reviewed the patient's most recent GI office note.  Amount and/or Complexity of Data Reviewed Radiology: ordered.  Risk OTC drugs. Prescription drug management.           Final Clinical Impression(s) / ED Diagnoses Final diagnoses:  Closed fracture of distal end of left  radius, unspecified fracture morphology, initial encounter    Rx / DC Orders ED Discharge Orders     None         Nathanael Baker, DO 06/16/23 2139

## 2023-06-16 NOTE — ED Triage Notes (Signed)
 Pt POV after falling backwards in garage, landed on L side, reporting pain in L buttocks and L wrist pain. Denies hitting head, is not on blood thinners.

## 2023-06-16 NOTE — ED Notes (Signed)
 Patient transported to X-ray

## 2023-06-16 NOTE — Discharge Instructions (Addendum)
 While you were in the emergency room, you had x-rays done of your wrist and your tailbone.  You have a fracture of your distal radius.  This likely will not require surgery.  Including discharge paperwork is a telephone number for hand surgeon.  Please call their office on Monday morning.  You can tell them that you are seen in the emergency room and diagnosed with a distal radius fracture.  You can take 1000 mg of Tylenol  every 8 hours.  You can take 5 mg of oxycodone  as needed for breakthrough pain.

## 2023-07-04 ENCOUNTER — Encounter: Payer: Self-pay | Admitting: Gastroenterology

## 2023-07-12 ENCOUNTER — Other Ambulatory Visit: Payer: Self-pay | Admitting: Gastroenterology

## 2023-07-12 ENCOUNTER — Ambulatory Visit: Admitting: Gastroenterology

## 2023-07-12 ENCOUNTER — Encounter: Payer: Self-pay | Admitting: Gastroenterology

## 2023-07-12 VITALS — BP 116/65 | HR 75 | Temp 98.0°F | Resp 13 | Ht 64.0 in | Wt 160.0 lb

## 2023-07-12 DIAGNOSIS — K2100 Gastro-esophageal reflux disease with esophagitis, without bleeding: Secondary | ICD-10-CM | POA: Diagnosis not present

## 2023-07-12 DIAGNOSIS — R1319 Other dysphagia: Secondary | ICD-10-CM

## 2023-07-12 DIAGNOSIS — K449 Diaphragmatic hernia without obstruction or gangrene: Secondary | ICD-10-CM

## 2023-07-12 DIAGNOSIS — K219 Gastro-esophageal reflux disease without esophagitis: Secondary | ICD-10-CM | POA: Diagnosis not present

## 2023-07-12 DIAGNOSIS — K222 Esophageal obstruction: Secondary | ICD-10-CM | POA: Diagnosis not present

## 2023-07-12 DIAGNOSIS — R1310 Dysphagia, unspecified: Secondary | ICD-10-CM | POA: Diagnosis not present

## 2023-07-12 DIAGNOSIS — R1013 Epigastric pain: Secondary | ICD-10-CM

## 2023-07-12 DIAGNOSIS — Q399 Congenital malformation of esophagus, unspecified: Secondary | ICD-10-CM | POA: Diagnosis not present

## 2023-07-12 DIAGNOSIS — T85521A Displacement of esophageal anti-reflux device, initial encounter: Secondary | ICD-10-CM | POA: Diagnosis not present

## 2023-07-12 DIAGNOSIS — K3189 Other diseases of stomach and duodenum: Secondary | ICD-10-CM | POA: Diagnosis not present

## 2023-07-12 MED ORDER — OMEPRAZOLE 40 MG PO CPDR: DELAYED_RELEASE_CAPSULE | ORAL | 0 refills | Status: DC

## 2023-07-12 MED ORDER — SUCRALFATE 1 GM/10ML PO SUSP: 1.0000 g | Freq: Four times a day (QID) | ORAL | 0 refills | Status: DC

## 2023-07-12 MED ORDER — SODIUM CHLORIDE 0.9 % IV SOLN: 500.0000 mL | Freq: Once | INTRAVENOUS | Status: DC

## 2023-07-12 NOTE — Patient Instructions (Addendum)
 Post dilation diet Increase Prilosec-40 mg by mouth twice a day for 12 weeks, then once daily Use sucralfate suspension - 1 gram by mouth four times a day for 2 weeks Handout on reflux and post esophageal dilation diet given    YOU HAD AN ENDOSCOPIC PROCEDURE TODAY AT THE [facility]:   Refer to the procedure report that was given to you for any specific questions about what was found during the examination.  If the procedure report does not answer your questions, please call your gastroenterologist to clarify.  If you requested that your care partner not be given the details of your procedure findings, then the procedure report has been included in a sealed envelope for you to review at your convenience later.  YOU SHOULD EXPECT: Some feelings of bloating in the abdomen. Passage of more gas than usual.  Walking can help get rid of the air that was put into your GI tract during the procedure and reduce the bloating. If you had a lower endoscopy (such as a colonoscopy or flexible sigmoidoscopy) you may notice spotting of blood in your stool or on the toilet paper. If you underwent a bowel prep for your procedure, you may not have a normal bowel movement for a few days.  Please Note:  You might notice some irritation and congestion in your nose or some drainage.  This is from the oxygen used during your procedure.  There is no need for concern and it should clear up in a day or so.  SYMPTOMS TO REPORT IMMEDIATELY:   Vomiting of blood or coffee ground material  New chest pain or pain under the shoulder blades  Painful or persistently difficult swallowing  New shortness of breath  Fever of 100F or higher  Black, tarry-looking stools  For urgent or emergent issues, a gastroenterologist can be reached at any hour by calling [phone]. Do not use MyChart messaging for urgent concerns.    DIET:  We do recommend a small meal at first, but then you may proceed to your regular diet.   Drink plenty of fluids but you should avoid alcoholic beverages for 24 hours.  ACTIVITY:  You should plan to take it easy for the rest of today and you should NOT DRIVE or use heavy machinery until tomorrow (because of the sedation medicines used during the test).    FOLLOW UP: Our staff will call the number listed on your records the next business day following your procedure.  We will call around 7:15- 8:00 am to check on you and address any questions or concerns that you may have regarding the information given to you following your procedure. If we do not reach you, we will leave a message.     If any biopsies were taken you will be contacted by phone or by letter within the next 1-3 weeks.  Please call us  at [phone] if you have not heard about the biopsies in 3 weeks.    SIGNATURES/CONFIDENTIALITY: You and/or your care partner have signed paperwork which will be entered into your electronic medical record.  These signatures attest to the fact that that the information above on your After Visit Summary has been reviewed and is understood.  Full responsibility of the confidentiality of this discharge information lies with you and/or your care-partner.

## 2023-07-12 NOTE — Op Note (Signed)
 [facility] Patient Name: Karen Horne Procedure Date: 07/12/2023 3:47 PM MRN: [id number] Endoscopist: Lajuan Pila , MD, [id number] Age: 73 Referring MD:  Date of Birth: 05/03/1950 Gender: Female Account #: [id number] Procedure:                Upper GI endoscopy Indications:              Dysphagia Medicines:                Monitored Anesthesia Care Procedure:                Pre-Anesthesia Assessment:                           - Prior to the procedure, a History and Physical                            was performed, and patient medications and                            allergies were reviewed. The patient's tolerance of                            previous anesthesia was also reviewed. The risks                            and benefits of the procedure and the sedation                            options and risks were discussed with the patient.                            All questions were answered, and informed consent                            was obtained. Prior Anticoagulants: The patient has                            taken no anticoagulant or antiplatelet agents. ASA                            Grade Assessment: II - A patient with mild systemic                            disease. After reviewing the risks and benefits,                            the patient was deemed in satisfactory condition to                            undergo the procedure.                           After obtaining informed consent, the endoscope was  passed under direct vision. Throughout the                            procedure, the patient's blood pressure, pulse, and                            oxygen saturations were monitored continuously. The                            GIF [id number] #[id number] was introduced through the                            mouth, and advanced to the second part of duodenum.                            The upper GI endoscopy was accomplished without                             difficulty. The patient tolerated the procedure                            well. Scope In: Scope Out: Findings:                 The middle third of the esophagus and lower third                            of the esophagus were moderately tortuous and                            foreshortened d/t HH.                           One benign-appearing, intrinsic moderate                            (circumferential scarring or stenosis; an endoscope                            may pass) stenosis was found 32 cm from the                            incisors with few small ulcers/erosions. This                            stenosis measured 1 cm (inner diameter) x less than                            one cm (in length). The stenosis was traversed. A                            TTS dilator was passed through the scope. Dilation                            with  a 12-13.5-15 mm balloon dilator was performed                            to 13.5 mm. The dilation site was examined and                            showed moderate mucosal disruption and moderate                            improvement in luminal narrowing. Biopsies were                            taken with a cold forceps for histology.                           A 3 cm hiatal hernia was present (extending from 32                            up to 35 cm).                           Evidence of a Nissen fundoplication was found in                            the cardia.                           The exam of the stomach was otherwise normal.                            Multiple biopsies were obtained to rule out HP.                           The examined duodenum was normal. Biopsies for                            histology were taken with a cold forceps for                            evaluation of celiac disease. Complications:            No immediate complications. Estimated Blood Loss:     Estimated blood loss:  none. Impression:               - Benign-appearing esophageal stenosis with Gd IV                            esophagitis. Dilated. Biopsied.                           - 3 cm hiatal hernia.                           - Slipped Nissen's fundoplication. Recommendation:           - Patient has a contact number available for  emergencies. The signs and symptoms of potential                            delayed complications were discussed with the                            patient. Return to normal activities tomorrow.                            Written discharge instructions were provided to the                            patient.                           - Postdilatation diet.                           - Increase Prilosec (omeprazole) 40 mg PO BID x 12                            weeks, then QD.                           - Use sucralfate suspension 1 gram PO QID for 2                            weeks.                           - Brochures regarding reflux.                           - Rpt EGD with further dilatation, if still with                            problems. Would preferably have upper GI series                            prior.                           - Pl chew food especially meats and breads well and                            eat slowly.                           - The findings and recommendations were discussed                            with the patient's family. Lajuan Pila, MD 07/12/2023 4:17:42 PM This report has been signed electronically.

## 2023-07-12 NOTE — Progress Notes (Signed)
 A/O x 3, gd SR's, VSS, report to RN

## 2023-07-12 NOTE — Progress Notes (Signed)
 05/30/2023 Karen Horne [id number] 16-Apr-1950   Referring provider: Lorella Roles, MD Primary GI doctor: Dr. Venice Gillis ( Dr. Adan Holms)   ASSESSMENT AND PLAN:    IBS with diarrhea/AB pain Colonoscopy unremarkable 03/2022 negative microscopic colitis 05/13/2023 CT abdomen pelvis with contrast for right lower quadrant abdominal pain showed no acute findings, small hiatal hernia unremarkable Continues to have diarrhea/incontinence/urgency worse after food once a week, no identified trigger, can have days without a BM but mainly has formed BM 2 x a day  can have mucus no hematochezia Worse since infection in [place] On Bentyl  10 mg as needed - get KUB to evaluate stool burden - possible SIBO, will treat with flagyl  250mg  TID for 10 days, can retreat if needed - add on fiber, given FoDMAP - consider anal rectal manometry/pelvic floor PT   GERD with history of hiatal hernia status fundoplication with epigastric discomfort, clears throat at night, some dysphagia since fundoplication with occ regurg, some early satiety, bloating RUQ AB pain, pin point tenderness, worse in the morning with movement Negative murphy 2009 EGD unremarkable Ultrasound 12/2021 unremarkable, 05/13/2023 RUQ Q US  negative On omeprazole 40 mg daily - EGD to evaluate with Dr. Venice Gillis, I discussed risks of EGD with patient today, including risk of sedation, bleeding or perforation.  Patient provides understanding and gave verbal consent to proceed. - likely component of chronic abdominal wall pain with pin point tenderness and + carrnett on exam, do salon pas patches, consider injection in the office if worsens or does not improve - consider UGI/barium if EGD negative, no signs of aspiration - gastroparesis diet given -Consider HIDA/GES   Fatty liver Seen on ultrasound 12/2021 Normal liver function     Latest Ref Rng & Units 05/13/2023    2:28 PM 05/04/2023    8:20 AM 05/13/2022    5:15 PM  Hepatic Function  Total  Protein 6.5 - 8.1 g/dL 6.0  7.0  6.7   Albumin 3.5 - 5.0 g/dL 3.5  4.0  4.2   AST 15 - 41 U/L 16  16  18    ALT 0 - 44 U/L 10  17  17    Alk Phosphatase 38 - 126 U/L 63  68  89   Total Bilirubin 0.0 - 1.2 mg/dL 0.9  0.6  0.8     Bilateral pneumonia 05/04/23 Treated with Z-Pak and Augmentin  Leukocytosis with thrombocytopenia, low protein 10 days afterwards. Unremarkable liver Recheck labs CBC c-Met for leukocytosis and thrombocytopenia potentially related to infection   Patient Care Team: Lorella Roles, MD as PCP - General (Family Medicine)   HISTORY OF PRESENT ILLNESS: 73 y.o. female with a past medical history of hypertension, IBS, hemorrhoids, GERD with history of hiatal hernia and previous fundoplication surgery, fatty liver disease, hypothyroidism, B12 deficiency and others listed below presents for follow-up of right upper quadrant pain and hemorrhoids last seen in the office 09/19/2022 by Dr. Venice Gillis   Discussed the use of AI scribe software for clinical note transcription with the patient, who gave verbal consent to proceed.   History of Present Illness   Karen Horne is a 73 year old female who presents with abdominal pain and diarrhea.   She experiences abdominal pain that she can localize with her finger, describing it as feeling 'blowed up like a balloon.' The pain is worse in the mornings and is associated with bloating. It is consistent but intensifies upon waking and moving around. No reflux symptoms have been noted since her fundoplication  surgery over 20 years ago.   She experiences diarrhea with urgency and fecal incontinence, occurring at least once a week. The diarrhea is described as large volume with mucus, but no blood or dark stools. The episodes are unpredictable, sometimes triggered by eating, but without a consistent dietary trigger. Normal bowel movements occur on other days, typically twice daily, with formed stools and no straining. A colonoscopy in 2024 for  diarrhea was negative, and a recent CT scan on May 13, 2023, for right lower abdominal pain showed a small hiatal hernia but was otherwise unremarkable. An ultrasound of the gallbladder on the same day was also unremarkable.   She continues to take omeprazole 40 mg, although it does not alleviate her current symptoms. She reports a history of food getting caught in her esophagus since her fundoplication, particularly with biscuits or pasta, sometimes requiring her to induce vomiting. This has not worsened over time.   She mentions a significant weight gain of 20 pounds over the last year despite not gaining weight for years prior. She feels full easily and experiences bloating, which she describes as feeling like her stomach is 'blown up.' She denies nausea or vomiting but notes difficulty breathing when feeling bloated.   She recalls a trip to [place] a few years ago where she experienced persistent diarrhea, which she feels has continued since then. She also reports throat clearing for two hours before sleep, which she attributes to a different issue.           She  reports that she has never smoked. She has never used smokeless tobacco. She reports that she does not drink alcohol and does not use drugs.   Current Medications:    Current Outpatient Medications (Endocrine & Metabolic):    levothyroxine  (SYNTHROID , LEVOTHROID) 75 MCG tablet, Take 75 mcg by mouth daily before breakfast.   Current Outpatient Medications (Cardiovascular):    ezetimibe (ZETIA) 10 MG tablet, Take 1 tablet by mouth daily.   lisinopril -hydrochlorothiazide  (ZESTORETIC ) 10-12.5 MG tablet, Take 1 tablet by mouth daily.   Current Outpatient Medications (Respiratory):    fluticasone (FLONASE) 50 MCG/ACT nasal spray, Place 1 spray into both nostrils daily.     Current Outpatient Medications (Hematological):    cyanocobalamin 1000 MCG tablet, Take 1,000 mcg by mouth daily.   Current Outpatient Medications (Other):     dicyclomine  (BENTYL ) 10 MG capsule, TAKE 1 CAPSULE (10 MG TOTAL) BY MOUTH 2 (TWO) TIMES DAILY AS NEEDED FOR SPASMS.   escitalopram (LEXAPRO) 10 MG tablet, Take 1 tablet by mouth daily.   metroNIDAZOLE  (FLAGYL ) 250 MG tablet, Take 1 tablet (250 mg total) by mouth 3 (three) times daily for 10 days.   omeprazole (PRILOSEC) 40 MG capsule, Take 40 mg by mouth daily.   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 Family History:  Her family history includes High blood pressure in her father, maternal grandfather, maternal grandmother, mother, paternal grandfather, and paternal grandmother.   REVIEW OF SYSTEMS  : All other systems reviewed and negative except where noted in the History of Present Illness.   PHYSICAL EXAM: BP 120/70 (BP Location: Left Arm, Patient Position: Sitting, Cuff Size: Normal)   Pulse 74   Ht 5' 4 (1.626 m)   Wt 160 lb 8 oz (72.8 kg)   BMI 27.55 kg/m  General:   Pleasant, well developed female in no acute distress Head:   Normocephalic and atraumatic. Eyes:  sclerae anicteric,conjunctive pink  Heart:   regular rate and rhythm Pulm:  Clear anteriorly; no wheezing Abdomen:   Soft, Non-distended AB, Active bowel sounds. No tenderness ., No organomegaly appreciated. Rectal: Normal external rectal exam, normal rectal tone, no internal hemorrhoids appreciated, no masses, non tender, brown stool, hemoccult  Negative Extremities:  Without edema. Msk: Symmetrical without gross deformities. Peripheral pulses intact.  Neurologic:  Alert and  oriented x4;  No focal deficits.  Skin:   Dry and intact without significant lesions or rashes. Psychiatric:  Cooperative. Normal mood and affect.   RELEVANT LABS AND IMAGING: CBC Labs (Brief)          Component Value Date/Time    WBC 10.7 (H) 05/13/2023 1428    RBC 3.94 05/13/2023 1428    HGB 12.3 05/13/2023 1428    HCT 36.3 05/13/2023 1428    PLT 146 (L) 05/13/2023 1428    MCV 92.1 05/13/2023 1428    MCH 31.2 05/13/2023 1428    MCHC 33.9 05/13/2023 1428    RDW 13.9 05/13/2023 1428    LYMPHSABS 1.3 05/13/2022 1715    MONOABS 0.4 05/13/2022 1715    EOSABS 0.0 05/13/2022 1715    BASOSABS 0.0 05/13/2022 1715        CMP     Labs (Brief)          Component Value Date/Time    NA 133 (L) 05/13/2023 1428    K 3.5 05/13/2023 1428    CL 99 05/13/2023 1428    CO2 27 05/13/2023 1428    GLUCOSE 100 (H) 05/13/2023 1428    BUN 19 05/13/2023 1428    CREATININE 0.98 05/13/2023 1428    CALCIUM  9.1 05/13/2023 1428    PROT 6.0 (L) 05/13/2023 1428    ALBUMIN 3.5 05/13/2023 1428    AST 16 05/13/2023 1428    ALT 10 05/13/2023 1428    ALKPHOS 63 05/13/2023 1428    BILITOT 0.9 05/13/2023 1428    GFRNONAA >60 05/13/2023 1428    GFRAA >60 02/26/2017 0524          Edmonia Gottron, PA-C   Attending physician's note   I have taken history, reviewed the chart and examined the patient. I performed a substantive portion of this encounter, including complete performance of at least one of the key components, in conjunction with the APP. I agree with the Advanced Practitioner's note, impression and recommendations.   For EGD today   Magnus Schuller, MD [facility] [phone]

## 2023-07-12 NOTE — Progress Notes (Signed)
VS by DT    

## 2023-07-12 NOTE — Progress Notes (Signed)
 Called to room to assist during endoscopic procedure.  Patient ID and intended procedure confirmed with present staff. Received instructions for my participation in the procedure from the performing physician.

## 2023-07-13 ENCOUNTER — Telehealth: Payer: Self-pay

## 2023-07-13 NOTE — Telephone Encounter (Signed)
 Attempted to reach patient for follow up phone call. No answer, left voicemail to contact Dr. Hobert Lull office with any questions or concerns.

## 2023-07-17 LAB — SURGICAL PATHOLOGY

## 2023-07-22 ENCOUNTER — Ambulatory Visit: Payer: Self-pay | Admitting: Gastroenterology

## 2023-08-17 ENCOUNTER — Encounter: Payer: Self-pay | Admitting: Advanced Practice Midwife

## 2023-08-28 ENCOUNTER — Encounter (HOSPITAL_BASED_OUTPATIENT_CLINIC_OR_DEPARTMENT_OTHER): Payer: Self-pay

## 2023-08-28 ENCOUNTER — Telehealth (HOSPITAL_BASED_OUTPATIENT_CLINIC_OR_DEPARTMENT_OTHER): Payer: Self-pay | Admitting: Emergency Medicine

## 2023-08-28 ENCOUNTER — Emergency Department (HOSPITAL_BASED_OUTPATIENT_CLINIC_OR_DEPARTMENT_OTHER)

## 2023-08-28 ENCOUNTER — Emergency Department (HOSPITAL_BASED_OUTPATIENT_CLINIC_OR_DEPARTMENT_OTHER)
Admission: EM | Admit: 2023-08-28 | Discharge: 2023-08-28 | Disposition: A | Discharge status: Home / Self Care | Attending: Emergency Medicine | Admitting: Emergency Medicine

## 2023-08-28 ENCOUNTER — Other Ambulatory Visit: Payer: Self-pay

## 2023-08-28 DIAGNOSIS — K589 Irritable bowel syndrome without diarrhea: Secondary | ICD-10-CM | POA: Insufficient documentation

## 2023-08-28 DIAGNOSIS — I700 Atherosclerosis of aorta: Secondary | ICD-10-CM | POA: Insufficient documentation

## 2023-08-28 DIAGNOSIS — I10 Essential (primary) hypertension: Secondary | ICD-10-CM | POA: Diagnosis not present

## 2023-08-28 DIAGNOSIS — R109 Unspecified abdominal pain: Secondary | ICD-10-CM | POA: Insufficient documentation

## 2023-08-28 DIAGNOSIS — Z79899 Other long term (current) drug therapy: Secondary | ICD-10-CM | POA: Insufficient documentation

## 2023-08-28 DIAGNOSIS — R112 Nausea with vomiting, unspecified: Secondary | ICD-10-CM | POA: Diagnosis not present

## 2023-08-28 DIAGNOSIS — E876 Hypokalemia: Secondary | ICD-10-CM

## 2023-08-28 DIAGNOSIS — R1084 Generalized abdominal pain: Secondary | ICD-10-CM | POA: Insufficient documentation

## 2023-08-28 DIAGNOSIS — K449 Diaphragmatic hernia without obstruction or gangrene: Secondary | ICD-10-CM | POA: Insufficient documentation

## 2023-08-28 DIAGNOSIS — R197 Diarrhea, unspecified: Secondary | ICD-10-CM | POA: Diagnosis present

## 2023-08-28 DIAGNOSIS — N179 Acute kidney failure, unspecified: Secondary | ICD-10-CM

## 2023-08-28 LAB — GASTROINTESTINAL PANEL BY PCR, STOOL (REPLACES STOOL CULTURE)

## 2023-08-28 LAB — CBC
HCT: 38.2 % (ref 36.0–46.0)
Hemoglobin: 12.6 g/dL (ref 12.0–15.0)
MCH: 30.1 pg (ref 26.0–34.0)
MCHC: 33 g/dL (ref 30.0–36.0)
MCV: 91.4 fL (ref 80.0–100.0)
Platelets: 194 K/uL (ref 150–400)
RBC: 4.18 MIL/uL (ref 3.87–5.11)
RDW: 13.2 % (ref 11.5–15.5)
WBC: 9.6 K/uL (ref 4.0–10.5)
nRBC: 0 % (ref 0.0–0.2)

## 2023-08-28 LAB — URINALYSIS, ROUTINE W REFLEX MICROSCOPIC
Bacteria, UA: NONE SEEN
Bilirubin Urine: NEGATIVE
Glucose, UA: NEGATIVE mg/dL
Ketones, ur: NEGATIVE mg/dL
Nitrite: NEGATIVE
Protein, ur: NEGATIVE mg/dL
Specific Gravity, Urine: 1.012 (ref 1.005–1.030)
pH: 5.5 (ref 5.0–8.0)

## 2023-08-28 LAB — COMPREHENSIVE METABOLIC PANEL WITH GFR
ALT: 11 U/L (ref 0–44)
AST: 17 U/L (ref 15–41)
Albumin: 3.8 g/dL (ref 3.5–5.0)
Alkaline Phosphatase: 84 U/L (ref 38–126)
Anion gap: 19 — ABNORMAL HIGH (ref 5–15)
BUN: 29 mg/dL — ABNORMAL HIGH (ref 8–23)
CO2: 17 mmol/L — ABNORMAL LOW (ref 22–32)
Calcium: 9.8 mg/dL (ref 8.9–10.3)
Chloride: 99 mmol/L (ref 98–111)
Creatinine, Ser: 1.78 mg/dL — ABNORMAL HIGH (ref 0.44–1.00)
GFR, Estimated: 29 mL/min — ABNORMAL LOW (ref >60)
Glucose, Bld: 108 mg/dL — ABNORMAL HIGH (ref 70–99)
Potassium: 2.9 mmol/L — ABNORMAL LOW (ref 3.5–5.1)
Sodium: 136 mmol/L (ref 135–145)
Total Bilirubin: 0.5 mg/dL (ref 0.0–1.2)
Total Protein: 7.1 g/dL (ref 6.5–8.1)

## 2023-08-28 LAB — MAGNESIUM: Magnesium: 2.1 mg/dL (ref 1.7–2.4)

## 2023-08-28 LAB — C DIFFICILE QUICK SCREEN W PCR REFLEX
C Diff antigen: NEGATIVE
C Diff interpretation: NOT DETECTED
C Diff toxin: NEGATIVE

## 2023-08-28 LAB — LIPASE, BLOOD: Lipase: 17 U/L (ref 11–51)

## 2023-08-28 MED ORDER — POTASSIUM CHLORIDE 10 MEQ/100ML IV SOLN
10.0000 meq | Freq: Once | INTRAVENOUS | Status: DC
  Filled 2023-08-28: qty 100

## 2023-08-28 MED ORDER — LIDOCAINE HCL (PF) 1 % IJ SOLN
INTRAMUSCULAR | Status: AC
  Administered 2023-08-28: 5 mL
  Filled 2023-08-28: qty 5

## 2023-08-28 MED ORDER — POTASSIUM CHLORIDE ER 10 MEQ PO TBCR: 10.0000 meq | EXTENDED_RELEASE_TABLET | Freq: Every day | ORAL | 0 refills | Status: DC

## 2023-08-28 MED ORDER — POTASSIUM CHLORIDE 10 MEQ/100ML IV SOLN
10.0000 meq | INTRAVENOUS | Status: DC
  Administered 2023-08-28 (×4): 10 meq via INTRAVENOUS
  Filled 2023-08-28 (×3): qty 100

## 2023-08-28 MED ORDER — ONDANSETRON HCL 4 MG/2ML IJ SOLN
4.0000 mg | Freq: Once | INTRAMUSCULAR | Status: DC
  Filled 2023-08-28: qty 2

## 2023-08-28 MED ORDER — POTASSIUM CHLORIDE CRYS ER 20 MEQ PO TBCR
40.0000 meq | EXTENDED_RELEASE_TABLET | Freq: Once | ORAL | Status: AC
  Administered 2023-08-28: 40 meq via ORAL
  Filled 2023-08-28: qty 2

## 2023-08-28 MED ORDER — LACTATED RINGERS IV BOLUS
1000.0000 mL | Freq: Once | INTRAVENOUS | Status: AC
  Administered 2023-08-28: 1000 mL via INTRAVENOUS

## 2023-08-28 MED ORDER — SODIUM CHLORIDE 0.9 % IV BOLUS
1000.0000 mL | Freq: Once | INTRAVENOUS | Status: AC
  Administered 2023-08-28: 1000 mL via INTRAVENOUS

## 2023-08-28 MED ORDER — ONDANSETRON 4 MG PO TBDP: 4.0000 mg | ORAL_TABLET | Freq: Three times a day (TID) | ORAL | 0 refills | Status: DC | PRN

## 2023-08-28 NOTE — Telephone Encounter (Signed)
 Patient's stool sample grew out E tach.  Do not treat this with antibiotics.  Called patient, got a confirmed voicemail with her name on it, left message detailing results.

## 2023-08-28 NOTE — Discharge Instructions (Signed)
 While you were in the emergency room, you had blood work done that showed that your potassium was low.  I have sent you some potassium pills to take at home.  Please take them for the next 1 week.  Make sure that you are getting plenty of water to drink over the next several days as you do have a mild acute kidney injury.  This is caused by dehydration.  You will be called if your C. difficile test is positive.  Return to the emergency room if you are unable to eat or drink, if you have worsening diarrhea, if you develop weakness.

## 2023-08-28 NOTE — ED Provider Notes (Signed)
  Physical Exam  BP 112/74   Pulse 67   Temp 97.8 F (36.6 C) (Oral)   Resp 17   SpO2 98%   Physical Exam  Procedures  Procedures  ED Course / MDM    Medical Decision Making Amount and/or Complexity of Data Reviewed Labs: ordered. Radiology: ordered.  Risk Prescription drug management.   I, Larnell Gravely, have assumed care for this patient.  In brief 73 year old female here today with diarrhea.  Patient was signed out pending blood work, CT imaging.  Her CT scan does not show acute process, some colitis.  She has not had any significant diarrhea since being here in the ED.  Her potassium was low at 2.9, she has a mild AKI.  Patient received 2 L of IV fluids here in the ED, 2 rounds of IV potassium, has taken 40 oral.  Offered admission for the patient, however she preferred to go home.  Discussed with the patient the importance of staying well-hydrated over the next several days.  Patient was agreeable to this, assured me she would return to the emergency room if she continued to have worsening diarrhea, was unable to eat or drink.  She will follow-up with her primary care doctor within 1 week for repeat labs.       Gravely Pac T, DO 08/28/23 1349

## 2023-08-28 NOTE — ED Notes (Signed)
 CRITICAL VALUE STICKER  CRITICAL VALUE: GI PCR  RECEIVER (on-site recipient of call):G. Crestina Strike RN  DATE & TIME NOTIFIED: 2:50om 08/28/2023  MESSENGER (representative from lab):  MD NOTIFIED: DR. Mannie  TIME OF NOTIFICATION:2:51PM  RESPONSE:  pending

## 2023-08-28 NOTE — ED Triage Notes (Signed)
 Pt presents via POV c/o N/V/D. Reports N/V on Sunday, diarrhea since Saturday. Reports recently dx with UTI and treated with Augmentin . Reports stopped abx on Friday due to N/V/D.

## 2023-08-28 NOTE — ED Provider Notes (Signed)
 [place] EMERGENCY DEPARTMENT AT [facility] Provider Note   CSN: [id number] Arrival date & time: 08/28/23  9386     Patient presents with: Abdominal Pain   Karen Horne is a 73 y.o. female.    Abdominal Pain Associated symptoms: diarrhea      73 year old female with medical history significant for HTN, HLD, IBS, depression presenting to the emergency department with nausea vomiting and diarrhea.  The patient had an episode of nausea and vomiting on Sunday.  She has had profuse episodes of watery diarrhea since this past Saturday.  She states that she was recently diagnosed with a UTI, initially seen at urgent care on 08/04/2023,, seen again on 08/15/2023 and placed on Augmentin .  Her last dose of the medication was on Friday because she developed symptoms of watery diarrhea on Saturday.  She denies any history of C. difficile infection.  She has had multiple episodes of watery diarrhea a day.  No fevers or chills.  She endorses crampy generalized abdominal discomfort.  No active vomiting.  Prior to Admission medications   Medication Sig Start Date End Date Taking? Authorizing Provider  cyanocobalamin 1000 MCG tablet Take 1,000 mcg by mouth daily.    [provider]  dicyclomine  (BENTYL ) 10 MG capsule TAKE 1 CAPSULE (10 MG TOTAL) BY MOUTH 2 (TWO) TIMES DAILY AS NEEDED FOR SPASMS. 08/22/22   Charlanne Groom, MD  escitalopram (LEXAPRO) 10 MG tablet Take 1 tablet by mouth daily. 03/31/23   [provider]  ezetimibe (ZETIA) 10 MG tablet Take 1 tablet by mouth daily. 03/31/23   [provider]  fluticasone (FLONASE) 50 MCG/ACT nasal spray Place 1 spray into both nostrils daily.    [provider]  HYDROcodone -acetaminophen  (NORCO/VICODIN) 5-325 MG tablet SMARTSIG:1 Tablet(s) By Mouth Every 8-12 Hours PRN 06/20/23   [provider]  levothyroxine  (SYNTHROID , LEVOTHROID) 75 MCG tablet Take 75 mcg by mouth daily before breakfast.    [provider]  lisinopril -hydrochlorothiazide  (ZESTORETIC ) 10-12.5 MG tablet Take 1 tablet by mouth daily. 04/23/20   [provider]  omeprazole  (PRILOSEC) 40 MG capsule Take 1 capsule (40 mg total) by mouth 2 (two) times daily. For 12 weeks 07/12/23   Charlanne Groom, MD  oxyCODONE  (ROXICODONE ) 5 MG immediate release tablet Take 1 tablet (5 mg total) by mouth every 4 (four) hours as needed for severe pain (pain score 7-10). 06/16/23   Mannie Fairy DASEN, DO  sucralfate  (CARAFATE ) 1 GM/10ML suspension Take 10 mLs (1 g total) by mouth 4 (four) times daily for 14 days. 07/12/23 07/26/23  Charlanne Groom, MD    Allergies: Adhesive [tape]    Review of Systems  Gastrointestinal:  Positive for abdominal pain and diarrhea.  All other systems reviewed and are negative.   Updated Vital Signs BP 106/83   Pulse 77   Temp 98 F (36.7 C) (Oral)   Resp 18   SpO2 98%   Physical Exam Vitals and nursing note reviewed.  Constitutional:      General: She is not in acute distress.    Appearance: She is well-developed.  HENT:     Head: Normocephalic and atraumatic.  Eyes:     Conjunctiva/sclera: Conjunctivae normal.  Cardiovascular:     Rate and Rhythm: Normal rate and regular rhythm.     Heart sounds: No murmur heard. Pulmonary:     Effort: Pulmonary effort is normal. No respiratory distress.     Breath sounds: Normal breath sounds.  Abdominal:  Palpations: Abdomen is soft.     Tenderness: There is generalized abdominal tenderness. There is no guarding or rebound.  Musculoskeletal:        General: No swelling.     Cervical back: Neck supple.  Skin:    General: Skin is warm and dry.     Capillary Refill: Capillary refill takes less than 2 seconds.  Neurological:     Mental Status: She is alert.  Psychiatric:        Mood and Affect: Mood normal.     (all labs ordered are listed, but only abnormal results are displayed) Labs Reviewed  GASTROINTESTINAL PANEL BY PCR, STOOL (REPLACES  STOOL CULTURE)  C DIFFICILE QUICK SCREEN W PCR REFLEX    CBC  LIPASE, BLOOD  COMPREHENSIVE METABOLIC PANEL WITH GFR  URINALYSIS, ROUTINE W REFLEX MICROSCOPIC    EKG: None  Radiology: No results found.   Procedures   Medications Ordered in the ED  lactated ringers  bolus 1,000 mL (1,000 mLs Intravenous New Bag/Given 08/28/23 9341)                                    Medical Decision Making Amount and/or Complexity of Data Reviewed Labs: ordered.     73 year old female with medical history significant for HTN, HLD, IBS, depression presenting to the emergency department with nausea vomiting and diarrhea.  The patient had an episode of nausea and vomiting on Sunday.  She has had profuse episodes of watery diarrhea since this past Saturday.  She states that she was recently diagnosed with a UTI, initially seen at urgent care on 08/04/2023,, seen again on 08/15/2023 and placed on Augmentin .  Her last dose of the medication was on Friday because she developed symptoms of watery diarrhea on Saturday.  She denies any history of C. difficile infection.  She has had multiple episodes of watery diarrhea a day.  No fevers or chills.  She endorses crampy generalized abdominal discomfort.  No active vomiting.  On arrival, the patient was afebrile, not tachycardic or tachypneic, BP 106/83 on arrival however repeat blood pressures revealed mild hypotension, saturating 98% on room air.  Mild generalized abdominal tenderness to palpation noted on exam.  Concern for C. difficile infection in the setting of recent antibiotic use outpatient.  IV access was obtained and the patient was administered an IV fluid bolus for volume resuscitation.  She is not actively vomiting, denies any nausea.  Will obtain screening labs and reassess the patient's volume status after initial interventions.  Signout given to Dr. Mannie at 0700.     Final diagnoses:  Diarrhea, unspecified type    ED Discharge Orders      None          Jerrol Agent, MD 08/28/23 0700

## 2023-09-14 ENCOUNTER — Other Ambulatory Visit: Payer: Self-pay

## 2023-09-14 ENCOUNTER — Inpatient Hospital Stay (HOSPITAL_BASED_OUTPATIENT_CLINIC_OR_DEPARTMENT_OTHER)
Admission: EM | Admit: 2023-09-14 | Discharge: 2023-09-20 | DRG: 372 | Disposition: A | Discharge status: Home / Self Care | Attending: Internal Medicine | Admitting: Internal Medicine

## 2023-09-14 ENCOUNTER — Encounter (HOSPITAL_BASED_OUTPATIENT_CLINIC_OR_DEPARTMENT_OTHER): Payer: Self-pay

## 2023-09-14 DIAGNOSIS — E871 Hypo-osmolality and hyponatremia: Secondary | ICD-10-CM | POA: Diagnosis present

## 2023-09-14 DIAGNOSIS — K219 Gastro-esophageal reflux disease without esophagitis: Secondary | ICD-10-CM | POA: Diagnosis present

## 2023-09-14 DIAGNOSIS — Z743 Need for continuous supervision: Secondary | ICD-10-CM | POA: Diagnosis not present

## 2023-09-14 DIAGNOSIS — F32A Depression, unspecified: Secondary | ICD-10-CM | POA: Diagnosis present

## 2023-09-14 DIAGNOSIS — T502X5A Adverse effect of carbonic-anhydrase inhibitors, benzothiadiazides and other diuretics, initial encounter: Secondary | ICD-10-CM | POA: Diagnosis present

## 2023-09-14 DIAGNOSIS — K580 Irritable bowel syndrome with diarrhea: Secondary | ICD-10-CM | POA: Diagnosis present

## 2023-09-14 DIAGNOSIS — N179 Acute kidney failure, unspecified: Secondary | ICD-10-CM | POA: Diagnosis present

## 2023-09-14 DIAGNOSIS — Z6826 Body mass index (BMI) 26.0-26.9, adult: Secondary | ICD-10-CM

## 2023-09-14 DIAGNOSIS — E8809 Other disorders of plasma-protein metabolism, not elsewhere classified: Secondary | ICD-10-CM | POA: Diagnosis present

## 2023-09-14 DIAGNOSIS — N189 Chronic kidney disease, unspecified: Secondary | ICD-10-CM | POA: Diagnosis present

## 2023-09-14 DIAGNOSIS — E876 Hypokalemia: Secondary | ICD-10-CM | POA: Diagnosis present

## 2023-09-14 DIAGNOSIS — Z91048 Other nonmedicinal substance allergy status: Secondary | ICD-10-CM

## 2023-09-14 DIAGNOSIS — K760 Fatty (change of) liver, not elsewhere classified: Secondary | ICD-10-CM | POA: Diagnosis present

## 2023-09-14 DIAGNOSIS — I129 Hypertensive chronic kidney disease with stage 1 through stage 4 chronic kidney disease, or unspecified chronic kidney disease: Secondary | ICD-10-CM | POA: Diagnosis present

## 2023-09-14 DIAGNOSIS — E039 Hypothyroidism, unspecified: Secondary | ICD-10-CM | POA: Diagnosis present

## 2023-09-14 DIAGNOSIS — A0472 Enterocolitis due to Clostridium difficile, not specified as recurrent: Principal | ICD-10-CM | POA: Diagnosis present

## 2023-09-14 DIAGNOSIS — R197 Diarrhea, unspecified: Secondary | ICD-10-CM

## 2023-09-14 DIAGNOSIS — E663 Overweight: Secondary | ICD-10-CM | POA: Diagnosis present

## 2023-09-14 DIAGNOSIS — I951 Orthostatic hypotension: Secondary | ICD-10-CM | POA: Diagnosis not present

## 2023-09-14 DIAGNOSIS — Z96652 Presence of left artificial knee joint: Secondary | ICD-10-CM | POA: Diagnosis present

## 2023-09-14 DIAGNOSIS — I959 Hypotension, unspecified: Secondary | ICD-10-CM | POA: Diagnosis not present

## 2023-09-14 DIAGNOSIS — Z79890 Hormone replacement therapy (postmenopausal): Secondary | ICD-10-CM

## 2023-09-14 DIAGNOSIS — E861 Hypovolemia: Secondary | ICD-10-CM | POA: Diagnosis present

## 2023-09-14 DIAGNOSIS — E872 Acidosis, unspecified: Secondary | ICD-10-CM | POA: Diagnosis present

## 2023-09-14 DIAGNOSIS — D631 Anemia in chronic kidney disease: Secondary | ICD-10-CM | POA: Diagnosis present

## 2023-09-14 DIAGNOSIS — E7800 Pure hypercholesterolemia, unspecified: Secondary | ICD-10-CM | POA: Diagnosis present

## 2023-09-14 DIAGNOSIS — F419 Anxiety disorder, unspecified: Secondary | ICD-10-CM | POA: Diagnosis present

## 2023-09-14 DIAGNOSIS — Z23 Encounter for immunization: Secondary | ICD-10-CM

## 2023-09-14 DIAGNOSIS — Z79899 Other long term (current) drug therapy: Secondary | ICD-10-CM

## 2023-09-14 DIAGNOSIS — E860 Dehydration: Secondary | ICD-10-CM | POA: Diagnosis present

## 2023-09-14 LAB — URINALYSIS, ROUTINE W REFLEX MICROSCOPIC
Bilirubin Urine: NEGATIVE
Glucose, UA: NEGATIVE mg/dL
Hgb urine dipstick: NEGATIVE
Ketones, ur: NEGATIVE mg/dL
Leukocytes,Ua: NEGATIVE
Nitrite: NEGATIVE
Protein, ur: NEGATIVE mg/dL
Specific Gravity, Urine: 1.005 (ref 1.005–1.030)
pH: 6 (ref 5.0–8.0)

## 2023-09-14 LAB — CBC
HCT: 39.2 % (ref 36.0–46.0)
Hemoglobin: 13.3 g/dL (ref 12.0–15.0)
MCH: 30.2 pg (ref 26.0–34.0)
MCHC: 33.9 g/dL (ref 30.0–36.0)
MCV: 88.9 fL (ref 80.0–100.0)
Platelets: 282 K/uL (ref 150–400)
RBC: 4.41 MIL/uL (ref 3.87–5.11)
RDW: 13.5 % (ref 11.5–15.5)
WBC: 10.1 K/uL (ref 4.0–10.5)
nRBC: 0 % (ref 0.0–0.2)

## 2023-09-14 LAB — COMPREHENSIVE METABOLIC PANEL WITH GFR
ALT: 22 U/L (ref 0–44)
AST: 26 U/L (ref 15–41)
Albumin: 3.6 g/dL (ref 3.5–5.0)
Alkaline Phosphatase: 80 U/L (ref 38–126)
Anion gap: 14 (ref 5–15)
BUN: 16 mg/dL (ref 8–23)
CO2: 24 mmol/L (ref 22–32)
Calcium: 9.8 mg/dL (ref 8.9–10.3)
Chloride: 95 mmol/L — ABNORMAL LOW (ref 98–111)
Creatinine, Ser: 1.41 mg/dL — ABNORMAL HIGH (ref 0.44–1.00)
GFR, Estimated: 39 mL/min — ABNORMAL LOW (ref >60)
Glucose, Bld: 108 mg/dL — ABNORMAL HIGH (ref 70–99)
Potassium: 2.8 mmol/L — ABNORMAL LOW (ref 3.5–5.1)
Sodium: 133 mmol/L — ABNORMAL LOW (ref 135–145)
Total Bilirubin: 0.4 mg/dL (ref 0.0–1.2)
Total Protein: 6.1 g/dL — ABNORMAL LOW (ref 6.5–8.1)

## 2023-09-14 LAB — MAGNESIUM: Magnesium: 2 mg/dL (ref 1.7–2.4)

## 2023-09-14 LAB — LACTIC ACID, PLASMA: Lactic Acid, Venous: 1.1 mmol/L (ref 0.5–1.9)

## 2023-09-14 LAB — LIPASE, BLOOD: Lipase: 46 U/L (ref 11–51)

## 2023-09-14 MED ORDER — POTASSIUM CHLORIDE 10 MEQ/100ML IV SOLN
10.0000 meq | INTRAVENOUS | Status: AC
  Administered 2023-09-14 (×2): 10 meq via INTRAVENOUS
  Filled 2023-09-14 (×2): qty 100

## 2023-09-14 MED ORDER — PNEUMOCOCCAL 20-VAL CONJ VACC 0.5 ML IM SUSY
0.5000 mL | PREFILLED_SYRINGE | INTRAMUSCULAR | Status: AC
  Administered 2023-09-17: 0.5 mL via INTRAMUSCULAR
  Filled 2023-09-14: qty 0.5

## 2023-09-14 MED ORDER — SODIUM CHLORIDE 0.9 % IV SOLN
INTRAVENOUS | Status: DC
Start: 2023-09-14 — End: 2023-09-19

## 2023-09-14 MED ORDER — POTASSIUM CHLORIDE CRYS ER 20 MEQ PO TBCR
20.0000 meq | EXTENDED_RELEASE_TABLET | Freq: Once | ORAL | Status: AC
  Administered 2023-09-14: 20 meq via ORAL
  Filled 2023-09-14: qty 1

## 2023-09-14 MED ORDER — ACETAMINOPHEN 650 MG RE SUPP: 650.0000 mg | Freq: Four times a day (QID) | RECTAL | Status: DC | PRN

## 2023-09-14 MED ORDER — ACETAMINOPHEN 325 MG PO TABS
650.0000 mg | ORAL_TABLET | Freq: Four times a day (QID) | ORAL | Status: DC | PRN
  Administered 2023-09-20: 650 mg via ORAL
  Filled 2023-09-14: qty 2

## 2023-09-14 MED ORDER — SODIUM CHLORIDE 0.9 % IV BOLUS
1000.0000 mL | Freq: Once | INTRAVENOUS | Status: AC
Start: 2023-09-14 — End: 2023-09-14
  Administered 2023-09-14: 1000 mL via INTRAVENOUS

## 2023-09-14 MED ORDER — POTASSIUM CHLORIDE 20 MEQ PO PACK
40.0000 meq | PACK | Freq: Once | ORAL | Status: AC
  Administered 2023-09-14: 40 meq via ORAL
  Filled 2023-09-14: qty 2

## 2023-09-14 MED ORDER — ENOXAPARIN SODIUM 40 MG/0.4ML IJ SOSY
40.0000 mg | PREFILLED_SYRINGE | Freq: Every day | INTRAMUSCULAR | Status: DC
  Administered 2023-09-14 – 2023-09-19 (×6): 40 mg via SUBCUTANEOUS
  Filled 2023-09-14 (×6): qty 0.4

## 2023-09-14 NOTE — ED Notes (Signed)
No complaints of dizziness during orthostatic vitals.

## 2023-09-14 NOTE — ED Provider Notes (Signed)
 [place] EMERGENCY DEPARTMENT AT [facility] Provider Note   CSN: [id number] Arrival date & time: 09/14/23  1721     Patient presents with: Diarrhea (/)   Karen Horne is a 73 y.o. female.    Patient with history of high cholesterol, hypertension, IBS with diarrhea followed by [facility], history of Nissen fundoplication, upper endoscopy 07/2023 showing esophagitis, colonoscopy 03/2022 negative for microscopic colitis --presents to the emergency department today for evaluation of ongoing diarrhea.  She reports approximately 6 watery stools per day without blood.  She reports having to use the restroom every time after eating.  No fevers or vomiting.  She has epigastric abdominal pain at times, rated 5 out of 10 at its worst.  No current pain.  She is not trying anything for diarrhea.  Reports trying to drink 2 L of water a day.  Denies dark urine or decreased urination.  Patient was seen in the emergency department on 7/29.  She had stool testing at that time that grew out enterotoxigenic E. coli.  No other growth.  States that she could not get a primary care follow-up prior to September.       Prior to Admission medications   Medication Sig Start Date End Date Taking? Authorizing Provider  amoxicillin -clavulanate (AUGMENTIN ) 875-125 MG tablet Take 1 tablet by mouth every 12 (twelve) hours. 08/15/23   [provider]  cyanocobalamin 1000 MCG tablet Take 1,000 mcg by mouth daily.    [provider]  dicyclomine  (BENTYL ) 10 MG capsule TAKE 1 CAPSULE (10 MG TOTAL) BY MOUTH 2 (TWO) TIMES DAILY AS NEEDED FOR SPASMS. 08/22/22   Charlanne Groom, MD  escitalopram  (LEXAPRO ) 10 MG tablet Take 1 tablet by mouth daily. 03/31/23   [provider]  ezetimibe (ZETIA) 10 MG tablet Take 1 tablet by mouth daily. 03/31/23   [provider]  fluticasone  (FLONASE ) 50 MCG/ACT nasal spray Place 1 spray into both nostrils daily.    [provider]   HYDROcodone -acetaminophen  (NORCO/VICODIN) 5-325 MG tablet SMARTSIG:1 Tablet(s) By Mouth Every 8-12 Hours PRN 06/20/23   [provider]  levothyroxine  (SYNTHROID , LEVOTHROID) 75 MCG tablet Take 75 mcg by mouth daily before breakfast.    [provider]  lisinopril -hydrochlorothiazide  (ZESTORETIC ) 10-12.5 MG tablet Take 1 tablet by mouth daily. 04/23/20   [provider]  nitrofurantoin, macrocrystal-monohydrate, (MACROBID) 100 MG capsule Take 100 mg by mouth 2 (two) times daily. 08/06/23   [provider]  omeprazole  (PRILOSEC) 40 MG capsule Take 1 capsule (40 mg total) by mouth 2 (two) times daily. For 12 weeks 07/12/23   Charlanne Groom, MD  ondansetron  (ZOFRAN -ODT) 4 MG disintegrating tablet Take 1 tablet (4 mg total) by mouth every 8 (eight) hours as needed for nausea or vomiting. 08/28/23   Mannie Pac T, DO  oxyCODONE  (ROXICODONE ) 5 MG immediate release tablet Take 1 tablet (5 mg total) by mouth every 4 (four) hours as needed for severe pain (pain score 7-10). 06/16/23   Mannie Pac DASEN, DO  potassium chloride  (KLOR-CON ) 10 MEQ tablet Take 1 tablet (10 mEq total) by mouth daily for 7 days. 08/28/23 09/04/23  Mannie Pac T, DO  sucralfate  (CARAFATE ) 1 GM/10ML suspension Take 10 mLs (1 g total) by mouth 4 (four) times daily for 14 days. 07/12/23 07/26/23  Charlanne Groom, MD    Allergies: Adhesive [tape]    Review of Systems  Updated Vital Signs Pulse 94   Temp 98.5 F (36.9 C) (Oral)   Resp 18  Ht 5' 3 (1.6 m)   Wt 66.7 kg   SpO2 99%   BMI 26.04 kg/m   Physical Exam Vitals and nursing note reviewed.  Constitutional:      General: She is not in acute distress.    Appearance: She is well-developed.  HENT:     Head: Normocephalic and atraumatic.     Right Ear: External ear normal.     Left Ear: External ear normal.     Nose: Nose normal.  Eyes:     Conjunctiva/sclera: Conjunctivae normal.  Cardiovascular:     Rate and Rhythm: Normal rate and  regular rhythm.     Heart sounds: No murmur heard. Pulmonary:     Effort: No respiratory distress.     Breath sounds: No wheezing, rhonchi or rales.  Abdominal:     Palpations: Abdomen is soft.     Tenderness: There is no abdominal tenderness. There is no guarding or rebound.  Musculoskeletal:     Cervical back: Normal range of motion and neck supple.     Right lower leg: No edema.     Left lower leg: No edema.  Skin:    General: Skin is warm and dry.     Findings: No rash.  Neurological:     General: No focal deficit present.     Mental Status: She is alert. Mental status is at baseline.     Motor: No weakness.  Psychiatric:        Mood and Affect: Mood normal.     (all labs ordered are listed, but only abnormal results are displayed) Labs Reviewed  COMPREHENSIVE METABOLIC PANEL WITH GFR - Abnormal; Notable for the following components:      Result Value   Sodium 133 (*)    Potassium 2.8 (*)    Chloride 95 (*)    Glucose, Bld 108 (*)    Creatinine, Ser 1.41 (*)    Total Protein 6.1 (*)    GFR, Estimated 39 (*)    All other components within normal limits  LIPASE, BLOOD  CBC  MAGNESIUM   LACTIC ACID, PLASMA  URINALYSIS, ROUTINE W REFLEX MICROSCOPIC  LACTIC ACID, PLASMA    ED ECG REPORT   Date: 09/14/2023  Rate: 80  Rhythm: normal sinus rhythm, PAC  QRS Axis: normal  Intervals: normal  ST/T Wave abnormalities: normal  Conduction Disutrbances:none  Narrative Interpretation:   Old EKG Reviewed: unchanged  I have personally reviewed the EKG tracing and agree with the computerized printout as noted.   Radiology: No results found.   Procedures   Medications Ordered in the ED  potassium chloride  10 mEq in 100 mL IVPB (has no administration in time range)  potassium chloride  (KLOR-CON ) packet 40 mEq (has no administration in time range)  sodium chloride  0.9 % bolus 1,000 mL (1,000 mLs Intravenous New Bag/Given 09/14/23 1814)   ED Course  Patient seen and  examined. History obtained directly from patient.   Labs/EKG: Ordered CBC, CMP, lipase, UA, magnesium   Imaging: None ordered  Medications/Fluids: Ordered: IV fluid bolus.   Most recent vital signs reviewed and are as follows: BP (!) 73/59   Pulse 79   Temp 98.5 F (36.9 C) (Oral)   Resp 19   Ht 5' 3 (1.6 m)   Wt 66.7 kg   SpO2 99%   BMI 26.04 kg/m   Initial impression: Ongoing diarrhea, will evaluate for electrolyte disturbance.   Orthostatic VS for the past 24 hrs:  BP- Lying Pulse- Lying  BP- Sitting Pulse- Sitting BP- Standing at 0 minutes Pulse- Standing at 0 minutes  09/14/23 1757 (!) 85/58 76 90/58 83 (!) 72/54 98   6:44 PM Reassessment performed. Patient appears stable.  Receiving IV fluids.  Labs personally reviewed and interpreted including: CBC unremarkable; CMP sodium 133, potassium 2.8, chloride 95, creatinine 1.41 with a BUN of 16; lipase normal; magnesium  normal.  Reviewed pertinent lab work and imaging with patient at bedside. Questions answered.   Most current vital signs reviewed and are as follows: BP (!) 88/57   Pulse 79   Temp 98.5 F (36.9 C) (Oral)   Resp 13   Ht 5' 3 (1.6 m)   Wt 66.7 kg   SpO2 99%   BMI 26.04 kg/m   Plan: Admit to hospital.   7:08 PM Discussed with Dr. Tobie, [facility], who accepts for admission.                                   Medical Decision Making Amount and/or Complexity of Data Reviewed Labs: ordered.   Patient with recent enterotoxigenic E. coli infection, ongoing diarrhea.  No significant abdominal pain or fever currently.  She has significant orthostatic with blood pressures dropping into the 70s systolic.  Fortunately she is not appreciably lightheaded.  Potassium is low.  Mild AKI.  She will need to be admitted for hydration, profound orthostatic hypotension, and electrolyte repletion.     Final diagnoses:  Orthostatic hypotension  Hypokalemia    ED Discharge Orders     None          Desiderio Chew, DEVONNA 09/14/23 1909    Geraldene Hamilton, MD 09/18/23 2149

## 2023-09-14 NOTE — H&P (Signed)
 History and Physical    Karen Horne FMW:[id number] DOB: [birth year] DOA: 09/14/2023  PCP: Haze Kingfisher, MD  Patient coming from: [facility] ED  Chief Complaint: Diarrhea  HPI: Karen Horne is a 73 y.o. 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  Patient was diagnosed with UTI a month ago and treated with Augmentin .  She developed frequent diarrhea afterwards.  She was seen in the ED on 7/29 and GI panel was positive for ETEC.  CT done at that time was showing mild colitis.  She had associated AKI with creatinine 1.78 (baseline 0.7-1.0).  Patient states she is no longer having any nausea or vomiting but continues to have frequent non-bloody diarrhea.  She is having diarrhea about 6 times a day.  She is trying to keep herself hydrated by drinking 2 L fluids every day.  She reports some mild upper abdominal discomfort every time she goes to the bathroom to have a bowel movement but not having any abdominal pain or discomfort otherwise.  Denies fevers or chills.  Denies recent sick contacts or travel.  She stays at home with her husband who is not having any diarrhea.  She reports history of chronic diarrhea for which she has had colonoscopy done in the past but her diarrhea has been worse for the past month.  No other complaints.  Denies cough, shortness of breath, or chest pain.  ED Course: Hypotensive on arrival to the ED with positive orthostatic vitals.  Afebrile.  Labs showing no leukocytosis or anemia, sodium 133, potassium 2.8, chloride 95, creatinine 1.4, normal lipase and LFTs, magnesium  2.0, lactic acid normal, UA not suggestive of infection.  Patient was given oral potassium 40 mEq, IV potassium 10 mEq x 2, 1 L  normal saline bolus, and started on normal saline infusion at 125 mL/hr.  Review of Systems:  Review of Systems  All other systems reviewed and are negative.   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 She has never used smokeless tobacco. She reports that she does not drink alcohol and does not use drugs.  Allergies  Allergen Reactions   Adhesive [Tape] Rash    Family History  Problem Relation Age of Onset   High blood pressure Mother    High blood pressure Father    High blood pressure Maternal Grandmother    High blood pressure Maternal Grandfather    High blood pressure Paternal Grandmother    High blood pressure Paternal Grandfather    Liver disease Neg Hx    Cancer - Colon Neg Hx    Esophageal cancer Neg  Hx    Colon cancer Neg Hx    Stomach cancer Neg Hx    Rectal cancer Neg Hx     Prior to Admission medications   Medication Sig Start Date End Date Taking? Authorizing Provider  amoxicillin -clavulanate (AUGMENTIN ) 875-125 MG tablet Take 1 tablet by mouth every 12 (twelve) hours. 08/15/23   [provider]  cyanocobalamin 1000 MCG tablet Take 1,000 mcg by mouth daily.    [provider]  dicyclomine  (BENTYL ) 10 MG capsule TAKE 1 CAPSULE (10 MG TOTAL) BY MOUTH 2 (TWO) TIMES DAILY AS NEEDED FOR SPASMS. 08/22/22   Charlanne Groom, MD  escitalopram   (LEXAPRO ) 10 MG tablet Take 1 tablet by mouth daily. 03/31/23   [provider]  ezetimibe (ZETIA) 10 MG tablet Take 1 tablet by mouth daily. 03/31/23   [provider]  fluticasone  (FLONASE ) 50 MCG/ACT nasal spray Place 1 spray into both nostrils daily.    [provider]  HYDROcodone -acetaminophen  (NORCO/VICODIN) 5-325 MG tablet SMARTSIG:1 Tablet(s) By Mouth Every 8-12 Hours PRN 06/20/23   [provider]  levothyroxine  (SYNTHROID , LEVOTHROID) 75 MCG tablet Take 75 mcg by mouth daily before breakfast.    [provider]  lisinopril -hydrochlorothiazide  (ZESTORETIC ) 10-12.5 MG tablet Take 1 tablet by mouth daily. 04/23/20   [provider]  nitrofurantoin, macrocrystal-monohydrate, (MACROBID) 100 MG capsule Take 100 mg by mouth 2 (two) times daily. 08/06/23   [provider]  omeprazole  (PRILOSEC) 40 MG capsule Take 1 capsule (40 mg total) by mouth 2 (two) times daily. For 12 weeks 07/12/23   Charlanne Groom, MD  ondansetron  (ZOFRAN -ODT) 4 MG disintegrating tablet Take 1 tablet (4 mg total) by mouth every 8 (eight) hours as needed for nausea or vomiting. 08/28/23   Mannie Pac T, DO  oxyCODONE  (ROXICODONE ) 5 MG immediate release tablet Take 1 tablet (5 mg total) by mouth every 4 (four) hours as needed for severe pain (pain score 7-10). 06/16/23   Mannie Pac DASEN, DO  potassium chloride  (KLOR-CON ) 10 MEQ tablet Take 1 tablet (10 mEq total) by mouth daily for 7 days. 08/28/23 09/04/23  Mannie Pac T, DO  sucralfate  (CARAFATE ) 1 GM/10ML suspension Take 10 mLs (1 g total) by mouth 4 (four) times daily for 14 days. 07/12/23 07/26/23  Charlanne Groom, MD    Physical Exam: Vitals:   09/14/23 1830 09/14/23 1855 09/14/23 1930 09/14/23 2041  BP: (!) 88/57 109/77 (!) 104/56 (!) 100/58  Pulse:  74 76 76  Resp: 13 (!) 25 12 13   Temp:    98.3 F (36.8 C)  TempSrc:    Temporal  SpO2: 99% 98% 100%   Weight:    67.1 kg  Height:    5' 4 (1.626 m)     Physical Exam Vitals reviewed.  Constitutional:      General: She is not in acute distress. Eyes:     Extraocular Movements: Extraocular movements intact.  Cardiovascular:     Rate and Rhythm: Normal rate and regular rhythm.     Heart sounds: Normal heart sounds.  Pulmonary:     Effort: Pulmonary effort is normal. No respiratory distress.     Breath sounds: Normal breath sounds. No wheezing, rhonchi or rales.  Abdominal:     General: Bowel sounds are normal. There is no distension.     Palpations: Abdomen is soft.     Tenderness: There is no abdominal tenderness. There is no guarding.  Musculoskeletal:     Cervical back: Normal range of motion.  Right lower leg: No edema.     Left lower leg: No edema.  Skin:    General: Skin is warm and dry.  Neurological:     General: No focal deficit present.     Mental Status: She is alert and oriented to person, place, and time.     Labs on Admission: I have personally reviewed following labs and imaging studies  CBC: Recent Labs  Lab 09/14/23 1742  WBC 10.1  HGB 13.3  HCT 39.2  MCV 88.9  PLT 282   Basic Metabolic Panel: Recent Labs  Lab 09/14/23 1742 09/14/23 1746  NA 133*  --   K 2.8*  --   CL 95*  --   CO2 24  --   GLUCOSE 108*  --   BUN 16  --   CREATININE 1.41*  --   CALCIUM  9.8  --   MG  --  2.0   GFR: Estimated Creatinine Clearance: 33 mL/min (A) (by C-G formula based on SCr of 1.41 mg/dL (H)). Liver Function Tests: Recent Labs  Lab 09/14/23 1742  AST 26  ALT 22  ALKPHOS 80  BILITOT 0.4  PROT 6.1*  ALBUMIN 3.6   Recent Labs  Lab 09/14/23 1742  LIPASE 46   No results for input(s): AMMONIA in the last 168 hours. Coagulation Profile: No results for input(s): INR, PROTIME in the last 168 hours. Cardiac Enzymes: No results for input(s): CKTOTAL, CKMB, CKMBINDEX, TROPONINI in the last 168 hours. BNP (last 3 results) No results for input(s): PROBNP in the last 8760  hours. HbA1C: No results for input(s): HGBA1C in the last 72 hours. CBG: No results for input(s): GLUCAP in the last 168 hours. Lipid Profile: No results for input(s): CHOL, HDL, LDLCALC, TRIG, CHOLHDL, LDLDIRECT in the last 72 hours. Thyroid  Function Tests: No results for input(s): TSH, T4TOTAL, FREET4, T3FREE, THYROIDAB in the last 72 hours. Anemia Panel: No results for input(s): VITAMINB12, FOLATE, FERRITIN, TIBC, IRON, RETICCTPCT in the last 72 hours. Urine analysis:    Component Value Date/Time   COLORURINE COLORLESS (A) 09/14/2023 1955   APPEARANCEUR CLEAR 09/14/2023 1955   LABSPEC 1.005 09/14/2023 1955   PHURINE 6.0 09/14/2023 1955   GLUCOSEU NEGATIVE 09/14/2023 1955   HGBUR NEGATIVE 09/14/2023 1955   BILIRUBINUR NEGATIVE 09/14/2023 1955   KETONESUR NEGATIVE 09/14/2023 1955   PROTEINUR NEGATIVE 09/14/2023 1955   UROBILINOGEN 0.2 09/10/2012 1231   NITRITE NEGATIVE 09/14/2023 1955   LEUKOCYTESUR NEGATIVE 09/14/2023 1955    Radiological Exams on Admission: No results found.  EKG: Independently reviewed.  Sinus rhythm, PACs.  No significant change since previous tracing.  Assessment and Plan  Persistent diarrhea History of ETEC colitis and IBS-D Patient was diagnosed with UTI a month ago and treated with Augmentin .  Since then, she is having persistent frequent non-bloody diarrhea.  She was seen in the ED on 7/29 and GI pathogen panel was positive for ETEC.  C. difficile testing was negative at that time.  CT done on 7/29 was showing mild colitis.  At present, patient is afebrile and has no leukocytosis or lactic acidosis on labs to suggest sepsis. Hypotension likely due to volume depletion in the setting of ongoing diarrhea.  Patient reports some mild upper abdominal discomfort only when she goes to the toilet to have a bowel movement but otherwise denies any abdominal pain or discomfort.  Abdominal exam benign.  Will hold off starting  antibiotics at this time.  Continue oral and IV rehydration and electrolyte replacement.  Contact and enteric precautions for now although infectious diarrhea would not be expected to last this long.  Consider consulting GI in the morning.  Orthostatic hypotension Likely due to hypovolemia.  Blood pressure improved after 1 L IV fluid bolus in the ED.  Continue IV fluid hydration and hold home antihypertensives at this time.  Repeat orthostatics in the morning.  AKI Likely prerenal from volume depletion/dehydration.  Creatinine was 1.78 during ED visit on 7/29 and has now improved to 1.4.  Baseline creatinine 0.7-1.0.  Continue IV fluid hydration and monitor renal function.  Avoid nephrotoxic agents/hold home lisinopril  and hydrochlorothiazide .  Hypokalemia In the setting of diarrhea.  Magnesium  is within normal range.  Continue to monitor labs and replace potassium as needed.  Mild hyponatremia Continue IV fluid hydration with normal saline and monitor sodium level.  Hyperlipidemia Hypothyroidism GERD Pharmacy med rec pending.  DVT prophylaxis: Lovenox  Code Status: Full Code (discussed with the patient) Family Communication: No family available at this time. Level of care: Progressive Care Unit Admission status: It is my clinical opinion that referral for OBSERVATION is reasonable and necessary in this patient based on the above information provided. The aforementioned taken together are felt to place the patient at high risk for further clinical deterioration. However, it is anticipated that the patient may be medically stable for discharge from the hospital within 24 to 48 hours.  Editha Ram MD [facility]  If 7PM-7AM, please contact night-coverage [email or web address]  09/14/2023, 8:50 PM

## 2023-09-14 NOTE — ED Notes (Signed)
 Pt unable to provide a urine sample at this time.

## 2023-09-14 NOTE — ED Triage Notes (Signed)
 Pt reports being diagnosed with e-colhi x2 weeks ago. Pt reports trying to stay hydrated but having difficulty.

## 2023-09-14 NOTE — Progress Notes (Signed)
 Plan of Care Note for accepted transfer   Patient: Karen Horne MRN: [id number]   DOA: 09/14/2023  Facility requesting transfer: [facility] Requesting Provider: Desiderio Chew, PA Reason for transfer: Orthostatic hypotension Facility course:  Patient is a 73 year old female with history of HTN, HLD, hypothyroidism who presents to the ED with persistent diarrhea.  Patient was diagnosed with UTI 1 month ago and treated with Augmentin .  She developed frequent diarrhea afterwards.  She was seen in the ED 7/29 and diagnosed with colitis.  GI panel positive for ETEC.  She had associated AKI with creatinine 1.78.  Patient has had persistent frequent diarrhea since then, up to 6 times per day.  She has felt dehydrated.  Patient was hypotensive on arrival to the ED with positive orthostatic vitals.  Labs notable for potassium 2.8, creatinine 1.41.  She was given 1 L normal saline fluid bolus with improved blood pressure.  She also received IV and oral potassium supplement.  Admission requested for ongoing management of hypovolemic hypotension and hypokalemia due to GI losses.  Plan of care: The patient is accepted for admission to Telemetry unit, at [facility] or [facility], first available.  Author: Jorie Blanch, MD 09/14/2023  Check [email or web address] for on-call coverage.  Nursing staff, Please call [facility] Admits & Consults System-Wide number on Amion as soon as patient's arrival, so appropriate admitting provider can evaluate the pt.

## 2023-09-15 DIAGNOSIS — A0472 Enterocolitis due to Clostridium difficile, not specified as recurrent: Secondary | ICD-10-CM | POA: Diagnosis present

## 2023-09-15 DIAGNOSIS — K219 Gastro-esophageal reflux disease without esophagitis: Secondary | ICD-10-CM | POA: Diagnosis present

## 2023-09-15 DIAGNOSIS — Z79899 Other long term (current) drug therapy: Secondary | ICD-10-CM | POA: Diagnosis not present

## 2023-09-15 DIAGNOSIS — Z23 Encounter for immunization: Secondary | ICD-10-CM | POA: Diagnosis present

## 2023-09-15 DIAGNOSIS — A09 Infectious gastroenteritis and colitis, unspecified: Secondary | ICD-10-CM | POA: Diagnosis not present

## 2023-09-15 DIAGNOSIS — F419 Anxiety disorder, unspecified: Secondary | ICD-10-CM | POA: Diagnosis present

## 2023-09-15 DIAGNOSIS — E860 Dehydration: Secondary | ICD-10-CM | POA: Diagnosis present

## 2023-09-15 DIAGNOSIS — T502X5A Adverse effect of carbonic-anhydrase inhibitors, benzothiadiazides and other diuretics, initial encounter: Secondary | ICD-10-CM | POA: Diagnosis present

## 2023-09-15 DIAGNOSIS — E8809 Other disorders of plasma-protein metabolism, not elsewhere classified: Secondary | ICD-10-CM | POA: Diagnosis present

## 2023-09-15 DIAGNOSIS — F32A Depression, unspecified: Secondary | ICD-10-CM | POA: Diagnosis present

## 2023-09-15 DIAGNOSIS — N189 Chronic kidney disease, unspecified: Secondary | ICD-10-CM | POA: Diagnosis present

## 2023-09-15 DIAGNOSIS — Z79890 Hormone replacement therapy (postmenopausal): Secondary | ICD-10-CM | POA: Diagnosis not present

## 2023-09-15 DIAGNOSIS — I129 Hypertensive chronic kidney disease with stage 1 through stage 4 chronic kidney disease, or unspecified chronic kidney disease: Secondary | ICD-10-CM | POA: Diagnosis present

## 2023-09-15 DIAGNOSIS — K760 Fatty (change of) liver, not elsewhere classified: Secondary | ICD-10-CM | POA: Diagnosis present

## 2023-09-15 DIAGNOSIS — E861 Hypovolemia: Secondary | ICD-10-CM | POA: Diagnosis present

## 2023-09-15 DIAGNOSIS — E663 Overweight: Secondary | ICD-10-CM | POA: Diagnosis present

## 2023-09-15 DIAGNOSIS — E872 Acidosis, unspecified: Secondary | ICD-10-CM | POA: Diagnosis present

## 2023-09-15 DIAGNOSIS — N179 Acute kidney failure, unspecified: Secondary | ICD-10-CM | POA: Diagnosis present

## 2023-09-15 DIAGNOSIS — E7800 Pure hypercholesterolemia, unspecified: Secondary | ICD-10-CM | POA: Diagnosis present

## 2023-09-15 DIAGNOSIS — E871 Hypo-osmolality and hyponatremia: Secondary | ICD-10-CM | POA: Diagnosis present

## 2023-09-15 DIAGNOSIS — R197 Diarrhea, unspecified: Secondary | ICD-10-CM | POA: Diagnosis not present

## 2023-09-15 DIAGNOSIS — I951 Orthostatic hypotension: Secondary | ICD-10-CM | POA: Diagnosis present

## 2023-09-15 DIAGNOSIS — E039 Hypothyroidism, unspecified: Secondary | ICD-10-CM | POA: Diagnosis present

## 2023-09-15 DIAGNOSIS — E876 Hypokalemia: Secondary | ICD-10-CM | POA: Diagnosis present

## 2023-09-15 DIAGNOSIS — D631 Anemia in chronic kidney disease: Secondary | ICD-10-CM | POA: Diagnosis present

## 2023-09-15 DIAGNOSIS — Z96652 Presence of left artificial knee joint: Secondary | ICD-10-CM | POA: Diagnosis present

## 2023-09-15 LAB — C DIFFICILE QUICK SCREEN W PCR REFLEX
C Diff antigen: POSITIVE — AB
C Diff toxin: NEGATIVE

## 2023-09-15 LAB — BASIC METABOLIC PANEL WITH GFR
Anion gap: 6 (ref 5–15)
BUN: 14 mg/dL (ref 8–23)
CO2: 21 mmol/L — ABNORMAL LOW (ref 22–32)
Calcium: 8.1 mg/dL — ABNORMAL LOW (ref 8.9–10.3)
Chloride: 106 mmol/L (ref 98–111)
Creatinine, Ser: 0.96 mg/dL (ref 0.44–1.00)
GFR, Estimated: >60 mL/min (ref >60)
Glucose, Bld: 99 mg/dL (ref 70–99)
Potassium: 3.1 mmol/L — ABNORMAL LOW (ref 3.5–5.1)
Sodium: 133 mmol/L — ABNORMAL LOW (ref 135–145)

## 2023-09-15 LAB — CLOSTRIDIUM DIFFICILE BY PCR, REFLEXED
Hypervirulent Strain: NEGATIVE
Toxigenic C. Difficile by PCR: POSITIVE — AB

## 2023-09-15 LAB — GLUCOSE, CAPILLARY: Glucose-Capillary: 99 mg/dL (ref 70–99)

## 2023-09-15 MED ORDER — LEVOTHYROXINE SODIUM 50 MCG PO TABS
75.0000 ug | ORAL_TABLET | Freq: Every day | ORAL | Status: DC
  Administered 2023-09-16 – 2023-09-20 (×5): 75 ug via ORAL
  Filled 2023-09-15 (×5): qty 1

## 2023-09-15 MED ORDER — HYDROXYZINE HCL 25 MG PO TABS
25.0000 mg | ORAL_TABLET | Freq: Every day | ORAL | Status: DC
  Administered 2023-09-15 – 2023-09-19 (×5): 25 mg via ORAL
  Filled 2023-09-15 (×5): qty 1

## 2023-09-15 MED ORDER — POTASSIUM CHLORIDE CRYS ER 20 MEQ PO TBCR
40.0000 meq | EXTENDED_RELEASE_TABLET | ORAL | Status: AC
  Administered 2023-09-15 (×2): 40 meq via ORAL
  Filled 2023-09-15 (×2): qty 2

## 2023-09-15 MED ORDER — ESCITALOPRAM OXALATE 10 MG PO TABS
10.0000 mg | ORAL_TABLET | Freq: Every day | ORAL | Status: DC
  Administered 2023-09-15 – 2023-09-20 (×6): 10 mg via ORAL
  Filled 2023-09-15 (×6): qty 1

## 2023-09-15 MED ORDER — MELATONIN 5 MG PO TABS
5.0000 mg | ORAL_TABLET | Freq: Once | ORAL | Status: AC
  Administered 2023-09-15: 5 mg via ORAL
  Filled 2023-09-15: qty 1

## 2023-09-15 NOTE — Progress Notes (Signed)
 [facility] Progress Note  Patient: Karen Horne     FMW:[id number]  DOA: 09/14/2023   PCP: Haze Kingfisher, MD       Brief hospital course: KYRAH SCHIRO is a 73 y.o. 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  Patient was diagnosed with UTI a month ago and treated with Augmentin .  She developed frequent diarrhea afterwards.  She was seen in the ED on 7/29 and GI panel was positive for ETEC.  CT done at that time was showing mild colitis.  She had associated AKI with creatinine 1.78 (baseline 0.7-1.0).  Patient states she is no longer having any nausea or vomiting but continues to have frequent non-bloody diarrhea.  She is having diarrhea about 6 times a day.  She is trying to keep herself hydrated by drinking 2 L fluids every day.  She reports some mild upper abdominal discomfort every time she goes to the bathroom to have a bowel movement but not having any abdominal pain or discomfort otherwise.  Denies fevers or chills.  Denies recent sick contacts or travel.  She stays at home with her husband who is not having any diarrhea.  She reports history of chronic diarrhea for which she has had colonoscopy done in the past but her diarrhea has been worse for the past month.    Subjective:  Has persistnet diarrhea. I evaluated her this morning and she stated she had 3 episodes so far since yesterday. Stool is non bloody and there is no abdominal pain. She has lost 15 lbs in 3 wks.   Assessment and Plan: Principal Problem:   Diarrhea - recent ETEC - C diff Ag +, Toxin - and PCR pending-  will hold off on treating this - have asked for a GI eval - Dr Stacia will evaluate her tomorrow  Active  Problems:   Orthostatic hypotension - Due to above - improving but BP still dropping with standing - cont NS at 125 cc/hr    AKI (acute kidney injury) (HCC) - Cr 141 - improved to 0.96    Hypokalemia - K 2.8 - improved to 3.1 - takes hydrochlorothiazide  as outpatient which has likely contributed to this    Hyponatremia - Na 133- still 133 - also likely due to hydrochlorothiazide  in setting of diarrhea and hypovolemia      Code Status: Full Code Total time on patient care: 35 min DVT prophylaxis:  enoxaparin  (LOVENOX ) injection 40 mg Start: 09/14/23 2215     Objective:   Vitals:   09/15/23 0042 09/15/23 0522 09/15/23 0800 09/15/23 1300  BP: (!) 105/55 (!) 93/53 (!) 95/59 98/72  Pulse: 84 80 72 70  Resp: 19 18    Temp: 98.2 F (36.8 C) 98.1 F (36.7 C) 98.1 F (36.7 C) 98.3 F (36.8 C)  TempSrc: Oral Oral Oral Oral  SpO2: 98% 97% 99% 99%  Weight:      Height:       Filed Weights   09/14/23 1737 09/14/23 2041  Weight: 66.7 kg 67.1 kg   Exam: General exam: Appears comfortable  HEENT: oral mucosa moist Respiratory system: Clear to auscultation.  Cardiovascular system: S1 & S2 heard  Gastrointestinal system: Abdomen soft, non-tender, nondistended. Normal bowel sounds   Extremities: No cyanosis, clubbing or edema Psychiatry:  Mood &  affect appropriate.      CBC: Recent Labs  Lab 09/14/23 1742  WBC 10.1  HGB 13.3  HCT 39.2  MCV 88.9  PLT 282   Basic Metabolic Panel: Recent Labs  Lab 09/14/23 1742 09/14/23 1746 09/15/23 0400  NA 133*  --  133*  K 2.8*  --  3.1*  CL 95*  --  106  CO2 24  --  21*  GLUCOSE 108*  --  99  BUN 16  --  14  CREATININE 1.41*  --  0.96  CALCIUM  9.8  --  8.1*  MG  --  2.0  --      Scheduled Meds:  enoxaparin  (LOVENOX ) injection  40 mg Subcutaneous QHS   escitalopram   10 mg Oral Daily   hydrOXYzine   25 mg Oral QHS   [START ON 09/16/2023] levothyroxine   75 mcg Oral Q0600   pneumococcal 20-valent conjugate vaccine  0.5  mL Intramuscular Tomorrow-1000    Imaging and lab data personally reviewed   Author: [employer]  09/15/2023 6:33 PM  To contact [facility]>   Check the care team in [facility] and look for the attending/consulting [facility] provider listed  Log into [email or web address] and use [place]'s universal password   Go to> [facility]  and find provider  If you still have difficulty reaching the provider, please page the [facility] (Director on Call) for the Hospitalists listed on amion

## 2023-09-16 DIAGNOSIS — E860 Dehydration: Secondary | ICD-10-CM

## 2023-09-16 DIAGNOSIS — R197 Diarrhea, unspecified: Secondary | ICD-10-CM | POA: Diagnosis not present

## 2023-09-16 DIAGNOSIS — A0472 Enterocolitis due to Clostridium difficile, not specified as recurrent: Secondary | ICD-10-CM | POA: Diagnosis not present

## 2023-09-16 LAB — GASTROINTESTINAL PANEL BY PCR, STOOL (REPLACES STOOL CULTURE)

## 2023-09-16 LAB — BASIC METABOLIC PANEL WITH GFR
Anion gap: 5 (ref 5–15)
Anion gap: 8 (ref 5–15)
BUN: 14 mg/dL (ref 8–23)
BUN: 12 mg/dL (ref 8–23)
CO2: 20 mmol/L — ABNORMAL LOW (ref 22–32)
CO2: 21 mmol/L — ABNORMAL LOW (ref 22–32)
Calcium: 8.2 mg/dL — ABNORMAL LOW (ref 8.9–10.3)
Calcium: 8.2 mg/dL — ABNORMAL LOW (ref 8.9–10.3)
Chloride: 112 mmol/L — ABNORMAL HIGH (ref 98–111)
Chloride: 108 mmol/L (ref 98–111)
Creatinine, Ser: 0.92 mg/dL (ref 0.44–1.00)
Creatinine, Ser: 0.96 mg/dL (ref 0.44–1.00)
GFR, Estimated: >60 mL/min (ref >60)
GFR, Estimated: >60 mL/min (ref >60)
Glucose, Bld: 90 mg/dL (ref 70–99)
Glucose, Bld: 91 mg/dL (ref 70–99)
Potassium: 3.9 mmol/L (ref 3.5–5.1)
Potassium: 3.5 mmol/L (ref 3.5–5.1)
Sodium: 137 mmol/L (ref 135–145)
Sodium: 137 mmol/L (ref 135–145)

## 2023-09-16 LAB — LACTOFERRIN, FECAL, QUALITATIVE: Lactoferrin, Fecal, Qual: POSITIVE — AB

## 2023-09-16 MED ORDER — VANCOMYCIN HCL 125 MG PO CAPS
125.0000 mg | ORAL_CAPSULE | Freq: Four times a day (QID) | ORAL | Status: DC
  Administered 2023-09-16 – 2023-09-20 (×18): 125 mg via ORAL
  Filled 2023-09-16 (×19): qty 1

## 2023-09-16 NOTE — Consult Note (Signed)
 Consultation  Referring Provider:  Dr. Earley      Primary Care Physician:  Haze Kingfisher, MD Primary Gastroenterologist:       Dr. Charlanne  Reason for Consultation:     Persistent diarrhea         HPI:   Karen Horne is a 73 y.o. female with a history of IBS, depression, hypertension and CKD who was admitted with ongoing diarrhea and evidence of dehydration.  She was seen in the ER at [facility] on July 29 with nausea, vomiting and diarrhea with a few days duration.  She had been seen at urgent care on July 5 and diagnosed with a UTI.  She was seen again on July 16 and started on Augmentin .  She experienced worsening of her diarrhea shortly after starting the Augmentin .  Suspicion was high for C. difficile, but C. difficile testing was negative.  CT at that time showed focal thickening of the sigmoid colon, suspicious for colitis.  She had a mild acute kidney injury with mild hypokalemia.  She was discharged home with plans for outpatient follow-up.  She presented again to the ED on August 15 with ongoing diarrhea and fatigue.  In the ED she was noted to be hypotensive with positive orthostatics.  Labs notable for sodium 133, potassium 2.8, chloride 95, creatinine 1.4.  Her blood pressure proved after 1 L bolus.  GI pathogen panel on July 29 positive for enterotoxigenic E. coli, but this was felt to be unlikely to be the cause of her diarrhea given the chronicity of her symptoms, and she was not started on antibiotics.  A repeat C. difficile test during this admission showed positive antigen, but negative toxin.  Toxigenic C. difficile was detected on PCR.   She was seen in our office in January 2024 for persistent diarrhea which it started while she was traveling in [place] in [place] in the summer 2023. While there, she had watery stools up to 3 times a day.  Her symptoms had improved, but she continued to have postprandial loose stools with fried fatty foods. She underwent a  colonoscopy in March 2024 which was unremarkable, including normal random colon biopsies, negative for microscopic colitis. She reports that her diarrhea improved after this, but she does continue to have episodes of frequent loose stools, but not to the degree that she was experiencing the last 3 weeks.   Past Medical History:  Diagnosis Date   Arthritis    Chronic kidney disease pyelonephritis   Depression    Fatty liver    GERD (gastroesophageal reflux disease)    surgery for acid reflux 10 yrs ago   Hemorrhoids    Hypercholesterolemia    Hypertension    Hypothyroidism    IBS (irritable bowel syndrome)     Past Surgical History:  Procedure Laterality Date   arthroscopic knee Left    reflux surgery     TOTAL KNEE ARTHROPLASTY Left 09/17/2012   Procedure: TOTAL KNEE ARTHROPLASTY;  Surgeon: Maude KANDICE Herald, MD;  Location: [facility] OR;  Service: Orthopedics;  Laterality: Left;  Left total knee replacement    Family History  Problem Relation Age of Onset   High blood pressure Mother    High blood pressure Father    High blood pressure Maternal Grandmother    High blood pressure Maternal Grandfather    High blood pressure Paternal Grandmother    High blood pressure Paternal Grandfather    Liver disease Neg Hx    Cancer -  Colon Neg Hx    Esophageal cancer Neg Hx    Colon cancer Neg Hx    Stomach cancer Neg Hx    Rectal cancer Neg Hx      Social History   Tobacco Use   Smoking status: Never   Smokeless tobacco: Never  Vaping Use   Vaping status: Never Used  Substance Use Topics   Alcohol use: No    Comment: 1 a week   Drug use: No    Prior to Admission medications   Medication Sig Start Date End Date Taking? Authorizing Provider  cyanocobalamin 1000 MCG tablet Take 1,000 mcg by mouth daily.   Yes [provider]  escitalopram  (LEXAPRO ) 10 MG tablet Take 1 tablet by mouth daily. 03/31/23  Yes [provider]  ezetimibe (ZETIA) 10 MG tablet Take 1 tablet  by mouth daily. 03/31/23  Yes [provider]  fluticasone  (FLONASE ) 50 MCG/ACT nasal spray Place 1 spray into both nostrils daily.   Yes [provider]  levothyroxine  (SYNTHROID , LEVOTHROID) 75 MCG tablet Take 75 mcg by mouth daily before breakfast.   Yes [provider]  lisinopril -hydrochlorothiazide  (ZESTORETIC ) 10-12.5 MG tablet Take 1 tablet by mouth daily. 04/23/20  Yes [provider]  omeprazole  (PRILOSEC) 40 MG capsule Take 1 capsule (40 mg total) by mouth 2 (two) times daily. For 12 weeks 07/12/23  Yes Charlanne Groom, MD  amoxicillin -clavulanate (AUGMENTIN ) 875-125 MG tablet Take 1 tablet by mouth every 12 (twelve) hours. Patient not taking: Reported on 09/15/2023 08/15/23   [provider]  dicyclomine  (BENTYL ) 10 MG capsule TAKE 1 CAPSULE (10 MG TOTAL) BY MOUTH 2 (TWO) TIMES DAILY AS NEEDED FOR SPASMS. Patient not taking: Reported on 09/15/2023 08/22/22   Charlanne Groom, MD  HYDROcodone -acetaminophen  (NORCO/VICODIN) 5-325 MG tablet SMARTSIG:1 Tablet(s) By Mouth Every 8-12 Hours PRN Patient not taking: Reported on 09/15/2023 06/20/23   [provider]  nitrofurantoin, macrocrystal-monohydrate, (MACROBID) 100 MG capsule Take 100 mg by mouth 2 (two) times daily. Patient not taking: Reported on 09/15/2023 08/06/23   [provider]  ondansetron  (ZOFRAN -ODT) 4 MG disintegrating tablet Take 1 tablet (4 mg total) by mouth every 8 (eight) hours as needed for nausea or vomiting. Patient not taking: Reported on 09/15/2023 08/28/23   Mannie Pac T, DO  oxyCODONE  (ROXICODONE ) 5 MG immediate release tablet Take 1 tablet (5 mg total) by mouth every 4 (four) hours as needed for severe pain (pain score 7-10). Patient not taking: Reported on 09/15/2023 06/16/23   Mannie Pac T, DO  potassium chloride  (KLOR-CON ) 10 MEQ tablet Take 1 tablet (10 mEq total) by mouth daily for 7 days. Patient not taking: Reported on 09/15/2023 08/28/23 09/04/23  Mannie Pac T, DO  sucralfate  (CARAFATE ) 1 GM/10ML suspension Take 10 mLs (1 g total) by mouth 4 (four) times daily for 14 days. Patient not taking: Reported on 09/15/2023 07/12/23 07/26/23  Charlanne Groom, MD    Current Facility-Administered Medications  Medication Dose Route Frequency Provider Last Rate Last Admin   0.9 %  sodium chloride  infusion   Intravenous Continuous Alfornia Madison, MD 125 mL/hr at 09/16/23 0251 New Bag at 09/16/23 0251   acetaminophen  (TYLENOL ) tablet 650 mg  650 mg Oral Q6H PRN Alfornia Madison, MD       Or   acetaminophen  (TYLENOL ) suppository 650 mg  650 mg Rectal Q6H PRN Alfornia Madison, MD       enoxaparin  (LOVENOX ) injection 40 mg  40 mg Subcutaneous QHS Alfornia Madison, MD  40 mg at 09/15/23 2148   escitalopram  (LEXAPRO ) tablet 10 mg  10 mg Oral Daily Rizwan, Saima, MD   10 mg at 09/15/23 1100   hydrOXYzine  (ATARAX ) tablet 25 mg  25 mg Oral QHS Rizwan, Saima, MD   25 mg at 09/15/23 2146   levothyroxine  (SYNTHROID ) tablet 75 mcg  75 mcg Oral Q0600 Rizwan, Saima, MD   75 mcg at 09/16/23 9342   pneumococcal 20-valent conjugate vaccine (PREVNAR 20 ) injection 0.5 mL  0.5 mL Intramuscular Tomorrow-1000 Alfornia Madison, MD        Allergies as of 09/14/2023 - Review Complete 09/14/2023  Allergen Reaction Noted   Adhesive [tape] Rash 09/10/2012     Review of Systems:    As per HPI, otherwise negative    Physical Exam:  Vital signs in last 24 hours: Temp:  [97.9 F (36.6 C)-98.3 F (36.8 C)] 97.9 F (36.6 C) (08/17 0447) Pulse Rate:  [70-77] 70 (08/17 0447) Resp:  [17] 17 (08/17 0447) BP: (92-98)/(54-72) 94/54 (08/17 0447) SpO2:  [96 %-99 %] 96 % (08/17 0447) Last BM Date : 09/15/23 General:   Pleasant Caucasian in NAD Head:  Normocephalic and atraumatic. Eyes:   No icterus.   Conjunctiva pink. Ears:  Normal auditory acuity. Neck:  Supple Lungs:  Respirations even and unlabored. Lungs clear to auscultation bilaterally.   No wheezes, crackles,  or rhonchi.  Heart:  Regular rate and rhythm; no MRG Abdomen:  Soft, nondistended, nontender.  Hyperactive bowel sounds. No appreciable masses or hepatomegaly.  Rectal:  Not performed.  Msk:  Symmetrical without gross deformities.  Extremities:  Without edema. Neurologic:  Alert and  oriented x4;  grossly normal neurologically. Skin:  Intact without significant lesions or rashes. Psych:  Alert and cooperative. Normal affect.  LAB RESULTS: Recent Labs    09/14/23 1742  WBC 10.1  HGB 13.3  HCT 39.2  PLT 282   BMET Recent Labs    09/14/23 1742 09/15/23 0400 09/16/23 0323  NA 133* 133* 137  K 2.8* 3.1* 3.9  CL 95* 106 112*  CO2 24 21* 20*  GLUCOSE 108* 99 90  BUN 16 14 14   CREATININE 1.41* 0.96 0.92  CALCIUM  9.8 8.1* 8.2*   LFT Recent Labs    09/14/23 1742  PROT 6.1*  ALBUMIN 3.6  AST 26  ALT 22  ALKPHOS 80  BILITOT 0.4   PT/INR No results for input(s): LABPROT, INR in the last 72 hours.  STUDIES: No results found.   PREVIOUS ENDOSCOPIES:            EGD July 12, 2023 (Dr. Charlanne) Indication: Dysphagia Tortuous, foreshortened esophagus secondary to hiatal hernia Esophageal stricture at 32 cm with a few small ulcers/erosions, dilated to 13.5 mm with moderate mucosal disruption 3 cm hiatal hernia Evidence of prior Nissen fundoplication Normal stomach, biopsied Normal duodenum, biopsied  FINAL DIAGNOSIS       1. Surgical [P], small bowel biopsies :      -DUODENAL MUCOSA WITH FOCAL FOVEOLAR METAPLASIA.      -NORMAL LONG AND SLENDER VILLI WITHOUT IDENTIFIED PATHOLOGIC INTRAEPITHELIAL      LYMPHOCYTOSIS.       2. Surgical [P], gastric biopsies :      -GASTRIC MUCOSA WITH NO SPECIFIC PATHOLOGIC DIAGNOSIS.      -NO INFLAMMATORY PATTERN PREDICTIVE OF HELICOBACTER PYLORI INFECTION IDENTIFIED.      -NEGATIVE FOR INTESTINAL METAPLASIA AND MALIGNANCY.       3. Surgical [P], distal esophagus (esophageal stricture) :      -  SQUAMOUS MUCOSA WITH MILD REACTIVE  CHANGES (BASAL HYPERPLASIA, ELONGATED      PAPILLAE) CONSISTENT WITH REFLUX EFFECT.      -NEGATIVE FOR ACTIVE ESOPHAGITIS AND INTRASQUAMOUS EOSINOPHILS.      -NO VIRAL CYTOPATHIC CHANGE OR FUNGI IDENTIFIED (ON H&E).      -NEGATIVE FOR DYSPLASIA AND MALIGNANCY.      -NO GLANDULAR MUCOSA PRESENT.   Colonoscopy April 26, 2022 (Dr. Charlanne) Indication: Diarrhea, positive Cologuard Normal colon, biopsied for microscopic colitis Sigmoid diverticulosis Grade 1 internal hemorrhoids  Diagnosis Surgical [P], random colon bx - BENIGN COLONIC MUCOSA WITH LYMPHOID AGGREGATES - NEGATIVE FOR MICROSCOPIC COLITIDES    Impression / Plan:   73 year old female with history of chronic diarrhea/IBS symptoms which started while traveling in [place] in 2023, who developed more severe, persistent diarrhea last month after taking Augmentin  for a UTI.  This diarrhea has not abated, and has led to dehydration and acute kidney injury. Initial C. difficile testing on July 29 was negative, but repeat testing this admission showed positive antigen testing and PCR testing positive for toxigenic C. difficile.  The toxin was not detected. Given the persistence and severity of the patient's diarrhea, the colon wall thickening on CT scan, and the clinical history of exacerbation of diarrhea following antibiotics, I think the most likely cause of her diarrhea is C. difficile infection, despite the absence of toxin. Recommend starting treatment with oral vancomycin  125 mg every 6 hours for 10 days. We discussed the risk for recurrent C. difficile, particularly with subsequent antibiotic use.  The patient states that she has been advised to take antibiotics with dental procedures given her history of knee arthroplasty.  If this is truly necessary, she may benefit from prophylactic oral vancomycin  for subsequent antibiotics.   We discussed the risk of transmission at home, and provided guidance on hand hygiene and maintaining  separate toileting areas from home contacts to reduce the risk of transmission.  Persistent, severe diarrhea, likely C. difficile - Start vancomycin  as above - Can consider alternative diagnoses if patient does not improve with vancomycin . - Patient can likely be discharged tomorrow, assuming no clinical worsening.  Dr. Wilhelmenia will be taking over inpatient GI service tomorrow    Thanks   LOS: 1 day   Karen Horne  09/16/2023, 7:28 AM

## 2023-09-16 NOTE — Progress Notes (Signed)
 [facility] Progress Note  Patient: Karen Horne     FMW:[id number]  DOA: 09/14/2023   PCP: Haze Kingfisher, MD       Brief hospital course: Karen Horne is a 73 y.o. 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  Patient was diagnosed with UTI a month ago and treated with Augmentin .  She developed frequent diarrhea afterwards.  She was seen in the ED on 7/29 and GI panel was positive for ETEC.  CT done at that time was showing mild colitis.  She had associated AKI with creatinine 1.78 (baseline 0.7-1.0).  Patient states she is no longer having any nausea or vomiting but continues to have frequent non-bloody diarrhea.  She is having diarrhea about 6 times a day.  She is trying to keep herself hydrated by drinking 2 L fluids every day.  She reports some mild upper abdominal discomfort every time she goes to the bathroom to have a bowel movement but not having any abdominal pain or discomfort otherwise.  Denies fevers or chills.  Denies recent sick contacts or travel.  She stays at home with her husband who is not having any diarrhea.  She reports history of chronic diarrhea for which she has had colonoscopy done in the past but her diarrhea has been worse for the past month.    Subjective:  Ongoing diarrhea. No abdominal pain  Assessment and Plan: Principal Problem:   Diarrhea - recent ETEC - C diff Ag +, Toxin - and PCR pending-  will hold off on treating this - have asked for a GI eval - Dr Stacia will evaluate her tomorrow  Active Problems:   Orthostatic hypotension - Due to above - improving but BP still dropping with standing - cont NS at 125 cc/hr    AKI (acute kidney injury) (HCC) - Cr  141 - improved to 0.96    Hypokalemia - K 2.8 > 3.1> 3.9 - takes hydrochlorothiazide  as outpatient which has likely contributed to this    Hyponatremia - Na 133>133> 137 - also likely due to hydrochlorothiazide  in setting of diarrhea and hypovolemia      Code Status: Full Code Total time on patient care: 35 min DVT prophylaxis:  enoxaparin  (LOVENOX ) injection 40 mg Start: 09/14/23 2215     Objective:   Vitals:   09/15/23 2009 09/15/23 2109 09/16/23 0447 09/16/23 1403  BP: (!) 93/59 (!) 92/55 (!) 94/54 111/74  Pulse: 77 73 70 70  Resp:   17 20  Temp: 98 F (36.7 C)  97.9 F (36.6 C) 98.6 F (37 C)  TempSrc: Oral  Oral Oral  SpO2: 97% 97% 96% 99%  Weight:      Height:       Filed Weights   09/14/23 1737 09/14/23 2041  Weight: 66.7 kg 67.1 kg   Exam: General exam: Appears comfortable  HEENT: oral mucosa moist Respiratory system: Clear to auscultation.  Cardiovascular system: S1 & S2 heard  Gastrointestinal system: Abdomen soft, non-tender, nondistended. Normal bowel sounds   Extremities: No cyanosis, clubbing or edema Psychiatry:  Mood & affect appropriate.      CBC: Recent Labs  Lab 09/14/23 1742  WBC 10.1  HGB 13.3  HCT 39.2  MCV 88.9  PLT 282   Basic Metabolic Panel: Recent Labs  Lab 09/14/23 1742 09/14/23 1746 09/15/23 0400 09/16/23 0323  NA 133*  --  133* 137  K 2.8*  --  3.1* 3.9  CL 95*  --  106 112*  CO2 24  --  21* 20*  GLUCOSE 108*  --  99 90  BUN 16  --  14 14  CREATININE 1.41*  --  0.96 0.92  CALCIUM  9.8  --  8.1* 8.2*  MG  --  2.0  --   --      Scheduled Meds:  enoxaparin  (LOVENOX ) injection  40 mg Subcutaneous QHS   escitalopram   10 mg Oral Daily   hydrOXYzine   25 mg Oral QHS   levothyroxine   75 mcg Oral Q0600   pneumococcal 20-valent conjugate vaccine  0.5 mL Intramuscular Tomorrow-1000   vancomycin   125 mg Oral QID    Imaging and lab data personally reviewed   Author: Greta Yung  09/16/2023 2:22 PM  To contact  [facility]>   Check the care team in [facility] and look for the attending/consulting [facility] provider listed  Log into [email or web address] and use [place]'s universal password   Go to> [facility]  and find provider  If you still have difficulty reaching the provider, please page the [facility] (Director on Call) for the Hospitalists listed on amion

## 2023-09-16 NOTE — Plan of Care (Signed)
   Problem: Education: Goal: Knowledge of General Education information will improve Description: Including pain rating scale, medication(s)/side effects and non-pharmacologic comfort measures Outcome: Progressing   Problem: Safety: Goal: Ability to remain free from injury will improve Outcome: Progressing   Problem: Skin Integrity: Goal: Risk for impaired skin integrity will decrease Outcome: Progressing

## 2023-09-17 ENCOUNTER — Telehealth (HOSPITAL_COMMUNITY): Payer: Self-pay | Admitting: Pharmacy Technician

## 2023-09-17 ENCOUNTER — Other Ambulatory Visit (HOSPITAL_COMMUNITY): Payer: Self-pay

## 2023-09-17 DIAGNOSIS — A0472 Enterocolitis due to Clostridium difficile, not specified as recurrent: Secondary | ICD-10-CM | POA: Diagnosis not present

## 2023-09-17 DIAGNOSIS — A09 Infectious gastroenteritis and colitis, unspecified: Secondary | ICD-10-CM | POA: Diagnosis not present

## 2023-09-17 DIAGNOSIS — R197 Diarrhea, unspecified: Secondary | ICD-10-CM | POA: Diagnosis not present

## 2023-09-17 LAB — BASIC METABOLIC PANEL WITH GFR
Anion gap: 5 (ref 5–15)
BUN: 11 mg/dL (ref 8–23)
CO2: 19 mmol/L — ABNORMAL LOW (ref 22–32)
Calcium: 8.2 mg/dL — ABNORMAL LOW (ref 8.9–10.3)
Chloride: 114 mmol/L — ABNORMAL HIGH (ref 98–111)
Creatinine, Ser: 0.76 mg/dL (ref 0.44–1.00)
GFR, Estimated: >60 mL/min (ref >60)
Glucose, Bld: 114 mg/dL — ABNORMAL HIGH (ref 70–99)
Potassium: 3 mmol/L — ABNORMAL LOW (ref 3.5–5.1)
Sodium: 138 mmol/L (ref 135–145)

## 2023-09-17 NOTE — Telephone Encounter (Signed)
 Patient Product/process development scientist completed.    The patient is insured through [facility]. Patient has Medicare and is not eligible for a copay card, but may be able to apply for patient assistance or Medicare RX Payment Plan (Patient Must reach out to their plan, if eligible for payment plan), if available.    Ran test claim for vancomycin  125 mg and the current 10 day co-pay is $108.96.   This test claim was processed through [facility]- copay amounts may vary at other pharmacies due to pharmacy/plan contracts, or as the patient moves through the different stages of their insurance plan.     Reyes Sharps, CPHT Pharmacy Technician III Certified Patient Advocate [facility] Patient Advocate Team Direct Number: [phone]  Fax: [phone]

## 2023-09-17 NOTE — Progress Notes (Signed)
 [facility] Progress Note  Patient: Karen Horne     FMW:[id number]  DOA: 09/14/2023   PCP: Haze Kingfisher, MD       Brief hospital course: Karen Horne is a 73 y.o. 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  Patient was diagnosed with UTI a month ago and treated with Augmentin .  She developed frequent diarrhea afterwards.  She was seen in the ED on 7/29 and GI panel was positive for ETEC.  CT done at that time was showing mild colitis.  She had associated AKI with creatinine 1.78 (baseline 0.7-1.0).  Patient states she is no longer having any nausea or vomiting but continues to have frequent non-bloody diarrhea.  She is having diarrhea about 6 times a day.  She is trying to keep herself hydrated by drinking 2 L fluids every day.  She reports some mild upper abdominal discomfort every time she goes to the bathroom to have a bowel movement but not having any abdominal pain or discomfort otherwise.  Denies fevers or chills.  Denies recent sick contacts or travel.  She stays at home with her husband who is not having any diarrhea.  She reports history of chronic diarrhea for which she has had colonoscopy done in the past but her diarrhea has been worse for the past month.    Subjective:  Diarrhea appears to be improving.  Assessment and Plan: Principal Problem:   Diarrhea - recent ETEC - C diff Ag +, Toxin - and PCR pending-  will hold off on treating this - Started on vancomycin  and is improving  Active Problems:   Orthostatic hypotension-dehydration - Due to above - Urine output is not more than normal while on IV fluids-therefore we will continue IV fluids  - cont NS at 125 cc/hr    AKI  (acute kidney injury) (HCC) - Cr 141 - improved to 0.96    Hypokalemia - K2.8-has normalized - takes hydrochlorothiazide  as outpatient which has likely contributed to this    Hyponatremia - Na 133>133> 137 - also likely due to hydrochlorothiazide  in setting of diarrhea and hypovolemia      Code Status: Full Code Total time on patient care: 35 min DVT prophylaxis:  enoxaparin  (LOVENOX ) injection 40 mg Start: 09/14/23 2215     Objective:   Vitals:   09/16/23 1403 09/16/23 2005 09/17/23 0518 09/17/23 1300  BP: 111/74 99/65 104/76 110/72  Pulse: 70 84 72 70  Resp: 20 16 16    Temp: 98.6 F (37 C) 98 F (36.7 C) 98.9 F (37.2 C) 98 F (36.7 C)  TempSrc: Oral Oral Oral Oral  SpO2: 99% 98% 97% 98%  Weight:      Height:       Filed Weights   09/14/23 1737 09/14/23 2041  Weight: 66.7 kg 67.1 kg   Exam: General exam: Appears comfortable  HEENT: oral mucosa moist Respiratory system: Clear to auscultation.  Cardiovascular system: S1 & S2 heard  Gastrointestinal system: Abdomen soft, non-tender, nondistended. Normal bowel sounds   Extremities: No cyanosis, clubbing or edema Psychiatry:  Mood & affect appropriate.     CBC: Recent Labs  Lab 09/14/23 1742  WBC 10.1  HGB 13.3  HCT 39.2  MCV 88.9  PLT 282   Basic Metabolic Panel: Recent Labs  Lab  09/14/23 1742 09/14/23 1746 09/15/23 0400 09/16/23 0323 09/16/23 1445  NA 133*  --  133* 137 137  K 2.8*  --  3.1* 3.9 3.5  CL 95*  --  106 112* 108  CO2 24  --  21* 20* 21*  GLUCOSE 108*  --  99 90 91  BUN 16  --  14 14 12   CREATININE 1.41*  --  0.96 0.92 0.96  CALCIUM  9.8  --  8.1* 8.2* 8.2*  MG  --  2.0  --   --   --      Scheduled Meds:  enoxaparin  (LOVENOX ) injection  40 mg Subcutaneous QHS   escitalopram   10 mg Oral Daily   hydrOXYzine   25 mg Oral QHS   levothyroxine   75 mcg Oral Q0600   vancomycin   125 mg Oral QID    Imaging and lab data personally reviewed   Author: Michon Kaczmarek  09/17/2023 7:06  PM  To contact [facility]>   Check the care team in [facility] and look for the attending/consulting [facility] provider listed  Log into [email or web address] and use [place]'s universal password   Go to> [facility]  and find provider  If you still have difficulty reaching the provider, please page the [facility] (Director on Call) for the Hospitalists listed on amion

## 2023-09-17 NOTE — Progress Notes (Signed)
     [facility] Progress Note  CC:   Persistent diarrhea   Subjective:  Diarrhea continues to improve.  She had a BM last night and then was not up at all in the night with diarrhea.  No abdominal pain.  Objective:  Vital signs in last 24 hours: Temp:  [98 F (36.7 C)-98.9 F (37.2 C)] 98.9 F (37.2 C) (08/18 0518) Pulse Rate:  [70-84] 72 (08/18 0518) Resp:  [16-20] 16 (08/18 0518) BP: (99-111)/(65-76) 104/76 (08/18 0518) SpO2:  [97 %-99 %] 97 % (08/18 0518) Last BM Date : 09/17/23 General:  Alert, Well-developed, in NAD Heart:  Regular rate and rhythm; no murmurs Pulm:  CTAB.  No W/R/R. Abdomen:  Soft, non-distended.  BS present.  Non-tender. Extremities:  Without edema. Neurologic:  Alert and oriented x 4;  grossly normal neurologically. Psych:  Alert and cooperative. Normal mood and affect.  Intake/Output from previous day: 08/17 0701 - 08/18 0700 In: 120 [P.O.:120] Out: -  Intake/Output this shift: Total I/O In: 509.7 [I.V.:509.7] Out: -   Lab Results: Recent Labs    09/14/23 1742  WBC 10.1  HGB 13.3  HCT 39.2  PLT 282   BMET Recent Labs    09/15/23 0400 09/16/23 0323 09/16/23 1445  NA 133* 137 137  K 3.1* 3.9 3.5  CL 106 112* 108  CO2 21* 20* 21*  GLUCOSE 99 90 91  BUN 14 14 12   CREATININE 0.96 0.92 0.96  CALCIUM  8.1* 8.2* 8.2*   LFT Recent Labs    09/14/23 1742  PROT 6.1*  ALBUMIN 3.6  AST 26  ALT 22  ALKPHOS 80  BILITOT 0.4   Assessment / Plan: 73 year old female with history of chronic diarrhea/IBS symptoms which started while traveling in [place] in 2023, who developed more severe, persistent diarrhea last month after taking Augmentin  for a UTI.  This diarrhea has not abated, and has led to dehydration and acute kidney injury. Initial C. difficile testing on July 29 was negative, but repeat testing this admission showed positive antigen testing and PCR testing positive for toxigenic C. difficile.  The toxin was not  detected. Given the persistence and severity of the patient's diarrhea, the colon wall thickening on CT scan, and the clinical history of exacerbation of diarrhea following antibiotics, I think the most likely cause of her diarrhea is C. difficile infection, despite the absence of toxin. Recommend starting treatment with oral vancomycin  125 mg every 6 hours for 10 days. We discussed the risk for recurrent C. difficile, particularly with subsequent antibiotic use.  The patient states that she has been advised to take antibiotics with dental procedures given her history of knee arthroplasty.  If this is truly necessary, she may benefit from prophylactic oral vancomycin  for subsequent antibiotics.   We discussed the risk of transmission at home, and provided guidance on hand hygiene and maintaining separate toileting areas from home contacts to reduce the risk of transmission.   Persistent, severe diarrhea, likely C. Difficile:  Continuing to improve. - Vancomycin  125 mg four times daily for 10 days - Can consider alternative diagnoses if patient does not improve with vancomycin . - Patient ok for discharge from a GI standpoint once ready from hospitalist standpoint.  Will get her an OV follow-up.    LOS: 2 days   Karen Horne. Karen Horne  09/17/2023, 9:54 AM

## 2023-09-18 DIAGNOSIS — A0472 Enterocolitis due to Clostridium difficile, not specified as recurrent: Secondary | ICD-10-CM | POA: Diagnosis not present

## 2023-09-18 LAB — CALPROTECTIN, FECAL: Calprotectin, Fecal: 875 ug/g — ABNORMAL HIGH (ref 0–120)

## 2023-09-18 MED ORDER — ONDANSETRON 4 MG PO TBDP
4.0000 mg | ORAL_TABLET | Freq: Three times a day (TID) | ORAL | Status: DC
  Administered 2023-09-18 – 2023-09-20 (×2): 4 mg via ORAL
  Filled 2023-09-18 (×4): qty 1

## 2023-09-18 MED ORDER — POTASSIUM CHLORIDE CRYS ER 20 MEQ PO TBCR
40.0000 meq | EXTENDED_RELEASE_TABLET | Freq: Once | ORAL | Status: AC
  Administered 2023-09-18: 40 meq via ORAL
  Filled 2023-09-18: qty 2

## 2023-09-18 MED ORDER — DICYCLOMINE HCL 10 MG PO CAPS
10.0000 mg | ORAL_CAPSULE | Freq: Three times a day (TID) | ORAL | Status: DC
  Administered 2023-09-18 – 2023-09-20 (×7): 10 mg via ORAL
  Filled 2023-09-18 (×7): qty 1

## 2023-09-18 MED ORDER — SALINE SPRAY 0.65 % NA SOLN
1.0000 | NASAL | Status: DC | PRN
  Administered 2023-09-20: 1 via NASAL
  Filled 2023-09-18: qty 44

## 2023-09-18 NOTE — Progress Notes (Signed)
 [facility] Progress Note  Patient: Karen Horne     FMW:[id number]  DOA: 09/14/2023   PCP: Haze Kingfisher, MD       Brief hospital course: Karen Horne is a 73 y.o. 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  Patient was diagnosed with UTI a month ago and treated with Augmentin .  She developed frequent diarrhea afterwards.  She was seen in the ED on 7/29 and GI panel was positive for ETEC.  CT done at that time was showing mild colitis.  She had associated AKI with creatinine 1.78 (baseline 0.7-1.0).  Patient states she is no longer having any nausea or vomiting but continues to have frequent non-bloody diarrhea.  She is having diarrhea about 6 times a day.  She is trying to keep herself hydrated by drinking 2 L fluids every day.  She reports some mild upper abdominal discomfort every time she goes to the bathroom to have a bowel movement but not having any abdominal pain or discomfort otherwise.  Denies fevers or chills.  Denies recent sick contacts or travel.  She stays at home with her husband who is not having any diarrhea.  She reports history of chronic diarrhea for which she has had colonoscopy done in the past but her diarrhea has been worse for the past month.    Subjective:  Diarrhea appears to be improving.  Assessment and Plan:  Cdiff  Diarrhea - recent ETEC - vanc po  Persistent diarrhea  - no imodium d/t CDiff - given dehydration and no IV access (lost IV / skin redness and pt would like to leave this out) will dose zofran  + bentyl  prior to meals to at least hopefully slow gastrocolic reflex diarrhea somewhat, encourage po hydration  - monitor electrolytes   Orthostatic  hypotension-dehydration - Due to above - hjolding IV fluids lost access  AKI (acute kidney injury) (HCC) Cr 141 - improved to 0.96 - monitor BMP  Hypokalemia K2.8-has normalized - takes hydrochlorothiazide  as outpatient which has likely contributed to this, holding - monitor   Hyponatremia - Na 133>133> 137 - also likely due to hydrochlorothiazide  in setting of diarrhea and hypovolemia - monitor       Code Status: Full Code Total time on patient care: 35 min DVT prophylaxis:  enoxaparin  (LOVENOX ) injection 40 mg Start: 09/14/23 2215     Objective:   Vitals:   09/17/23 1300 09/17/23 2145 09/18/23 0609 09/18/23 1418  BP: 110/72 112/67 117/73 104/66  Pulse: 70 73 71 65  Resp:  15 18   Temp: 98 F (36.7 C) 98.6 F (37 C) 97.9 F (36.6 C) 98.7 F (37.1 C)  TempSrc: Oral Oral Oral Oral  SpO2: 98% 97% 98% 99%  Weight:      Height:       Filed Weights   09/14/23 1737 09/14/23 2041  Weight: 66.7 kg 67.1 kg   Exam: General exam: Appears comfortable  HEENT: oral mucosa moist Respiratory system: Clear to auscultation.  Cardiovascular system: S1 & S2 heard  Gastrointestinal system: Abdomen soft, non-tender, nondistended. Normal bowel sounds   Extremities: No cyanosis, clubbing or edema Psychiatry:  Mood & affect appropriate.     CBC: Recent Labs  Lab 09/14/23 1742  WBC 10.1  HGB 13.3  HCT 39.2  MCV 88.9  PLT 282   Basic Metabolic Panel: Recent Labs  Lab 09/14/23 1742 09/14/23 1746 09/15/23 0400 09/16/23 0323 09/16/23 1445 09/17/23 2122  NA 133*  --  133* 137 137 138  K 2.8*  --  3.1* 3.9 3.5 3.0*  CL 95*  --  106 112* 108 114*  CO2 24  --  21* 20* 21* 19*  GLUCOSE 108*  --  99 90 91 114*  BUN 16  --  14 14 12 11   CREATININE 1.41*  --  0.96 0.92 0.96 0.76  CALCIUM  9.8  --  8.1* 8.2* 8.2* 8.2*  MG  --  2.0  --   --   --   --      Scheduled Meds:  dicyclomine   10 mg Oral TID AC & HS   enoxaparin  (LOVENOX ) injection  40 mg Subcutaneous QHS    escitalopram   10 mg Oral Daily   hydrOXYzine   25 mg Oral QHS   levothyroxine   75 mcg Oral Q0600   ondansetron   4 mg Oral TID AC   vancomycin   125 mg Oral QID    Imaging and lab data personally reviewed   Author: Kaiyu Mirabal  09/18/2023 3:25 PM  To contact [facility]>   Check the care team in [facility] and look for the attending/consulting [facility] provider listed  Log into [email or web address] and use [facility]'s universal password   Go to> [facility]  and find provider  If you still have difficulty reaching the provider, please page the [facility] (Director on Call) for the Hospitalists listed on amion

## 2023-09-18 NOTE — Progress Notes (Signed)
 MD notified that patient lost IV access; because when RN went into patient's room to get ready to hang a new bag of IV fluids, RN noticed that above the IV site was red and swollen, and RN had to remove the IV. RN had to remove another IV from patient yesterday because it was about to do the same thing. RN inquired about continuous fluids because of this.

## 2023-09-18 NOTE — Progress Notes (Signed)
   09/18/23 1153  TOC Brief Assessment  Insurance and Status Reviewed  Patient has primary care physician Yes  Home environment has been reviewed home with spouse  Prior level of function: mod indepdnent  Prior/Current Home Services No current home services  Social Drivers of Health Review SDOH reviewed no interventions necessary  Readmission risk has been reviewed Yes  Transition of care needs no transition of care needs at this time

## 2023-09-19 DIAGNOSIS — N179 Acute kidney failure, unspecified: Secondary | ICD-10-CM

## 2023-09-19 DIAGNOSIS — A09 Infectious gastroenteritis and colitis, unspecified: Secondary | ICD-10-CM

## 2023-09-19 DIAGNOSIS — I951 Orthostatic hypotension: Secondary | ICD-10-CM | POA: Diagnosis not present

## 2023-09-19 DIAGNOSIS — A0472 Enterocolitis due to Clostridium difficile, not specified as recurrent: Secondary | ICD-10-CM | POA: Diagnosis not present

## 2023-09-19 DIAGNOSIS — E871 Hypo-osmolality and hyponatremia: Secondary | ICD-10-CM

## 2023-09-19 DIAGNOSIS — E876 Hypokalemia: Secondary | ICD-10-CM

## 2023-09-19 LAB — CBC WITH DIFFERENTIAL/PLATELET
Abs Immature Granulocytes: 0.03 K/uL (ref 0.00–0.07)
Basophils Absolute: 0 K/uL (ref 0.0–0.1)
Basophils Relative: 1 %
Eosinophils Absolute: 0.2 K/uL (ref 0.0–0.5)
Eosinophils Relative: 4 %
HCT: 35.3 % — ABNORMAL LOW (ref 36.0–46.0)
Hemoglobin: 10.9 g/dL — ABNORMAL LOW (ref 12.0–15.0)
Immature Granulocytes: 1 %
Lymphocytes Relative: 32 %
Lymphs Abs: 1.6 K/uL (ref 0.7–4.0)
MCH: 28.7 pg (ref 26.0–34.0)
MCHC: 30.9 g/dL (ref 30.0–36.0)
MCV: 92.9 fL (ref 80.0–100.0)
Monocytes Absolute: 0.4 K/uL (ref 0.1–1.0)
Monocytes Relative: 8 %
Neutro Abs: 2.8 K/uL (ref 1.7–7.7)
Neutrophils Relative %: 54 %
Platelets: 157 K/uL (ref 150–400)
RBC: 3.8 MIL/uL — ABNORMAL LOW (ref 3.87–5.11)
RDW: 14.5 % (ref 11.5–15.5)
WBC: 5.1 K/uL (ref 4.0–10.5)
nRBC: 0 % (ref 0.0–0.2)

## 2023-09-19 LAB — COMPREHENSIVE METABOLIC PANEL WITH GFR
ALT: 22 U/L (ref 0–44)
AST: 25 U/L (ref 15–41)
Albumin: 2.5 g/dL — ABNORMAL LOW (ref 3.5–5.0)
Alkaline Phosphatase: 62 U/L (ref 38–126)
Anion gap: 5 (ref 5–15)
BUN: 8 mg/dL (ref 8–23)
CO2: 21 mmol/L — ABNORMAL LOW (ref 22–32)
Calcium: 8.4 mg/dL — ABNORMAL LOW (ref 8.9–10.3)
Chloride: 113 mmol/L — ABNORMAL HIGH (ref 98–111)
Creatinine, Ser: 0.89 mg/dL (ref 0.44–1.00)
GFR, Estimated: >60 mL/min (ref >60)
Glucose, Bld: 87 mg/dL (ref 70–99)
Potassium: 3.5 mmol/L (ref 3.5–5.1)
Sodium: 139 mmol/L (ref 135–145)
Total Bilirubin: 0.3 mg/dL (ref 0.0–1.2)
Total Protein: 5 g/dL — ABNORMAL LOW (ref 6.5–8.1)

## 2023-09-19 LAB — PHOSPHORUS: Phosphorus: 2.8 mg/dL (ref 2.5–4.6)

## 2023-09-19 LAB — MAGNESIUM: Magnesium: 1.5 mg/dL — ABNORMAL LOW (ref 1.7–2.4)

## 2023-09-19 MED ORDER — POTASSIUM CHLORIDE CRYS ER 20 MEQ PO TBCR
40.0000 meq | EXTENDED_RELEASE_TABLET | Freq: Once | ORAL | Status: AC
  Administered 2023-09-19: 40 meq via ORAL
  Filled 2023-09-19: qty 2

## 2023-09-19 MED ORDER — FLUTICASONE PROPIONATE 50 MCG/ACT NA SUSP
1.0000 | Freq: Every day | NASAL | Status: DC
  Administered 2023-09-19 – 2023-09-20 (×2): 1 via NASAL
  Filled 2023-09-19: qty 16

## 2023-09-19 MED ORDER — MONTELUKAST SODIUM 10 MG PO TABS
10.0000 mg | ORAL_TABLET | Freq: Every day | ORAL | Status: DC
  Administered 2023-09-19: 10 mg via ORAL
  Filled 2023-09-19: qty 1

## 2023-09-19 MED ORDER — SODIUM CHLORIDE 0.9 % IV SOLN: INTRAVENOUS | Status: AC

## 2023-09-19 MED ORDER — MAGNESIUM SULFATE 4 GM/100ML IV SOLN
4.0000 g | Freq: Once | INTRAVENOUS | Status: AC
  Administered 2023-09-19: 4 g via INTRAVENOUS
  Filled 2023-09-19: qty 100

## 2023-09-19 NOTE — Progress Notes (Signed)
 PROGRESS NOTE    Karen Horne  FMW:[id number] DOB: 06/19/1950 DOA: 09/14/2023 PCP: Haze Kingfisher, MD   Brief Narrative:  Karen Horne is a 73 y.o. 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  Patient was diagnosed with UTI a month ago and treated with Augmentin .  She developed frequent diarrhea afterwards.  She was seen in the ED on 7/29 and GI panel was positive for ETEC.  CT done at that time was showing mild colitis.  She had associated AKI with creatinine 1.78 (baseline 0.7-1.0).  Patient states she is no longer having any nausea or vomiting but continues to have frequent non-bloody diarrhea.  She is having diarrhea about 6 times a day.  She is trying to keep herself hydrated by drinking 2 L fluids every day.  She reports some mild upper abdominal discomfort every time she goes to the bathroom to have a bowel movement but not having any abdominal pain or discomfort otherwise.    **Interim History Found to have C. difficile colitis and diarrhea and was placed on oral vancomycin  with slow improvement.  She was given IV fluid hydration and AKI is improved.  Electrolytes are still off in the setting of her diarrhea  Assessment and Plan:  C Diff Colitis and Diarrhea: Had recent ETEC and repeat GI pathogen panel was negative.  Fecal calprotectin was 875, C. difficile antigen was positive and toxigenic C. difficile by PCR was positive and fecal lactoferrin qualitative was positive C/w po Vancomycin  125 mg po 4x Daily. No Imodium d/t C Diff. IVF hydration as below. C/w Supportive Care and po Ondansetron  4 mg po TID before meals and Dicyclomine  10 mg AC/HS. Diarrhea is slowing down.  GI was consulted earlier this  admission but have signed off the case and recommending holding endoscopic evaluation for now and recommends continuing current therapy with oral vancomycin  and recommending considering Fidaxomicin in the future and if she has a bout of potential recurrence and they recommend considering Vowst/Rebyota.    Orthostatic Hypotension due to Dehydration from C Diff Colitis and Diarrhea: Due to above; IVF as below. Repeat Orthostatic VS   AKI / Metabolic Acidosis: Improved. BUN/Cr Trend: Recent Labs  Lab 08/28/23 0632 09/14/23 1742 09/15/23 0400 09/16/23 0323 09/16/23 1445 09/17/23 2122 09/19/23 1103  BUN 29* 16 14 14 12 11 8   CREATININE 1.78* 1.41* 0.96 0.92 0.96 0.76 0.89  -Decrease IVF with NS @ 125 mL/hr -> 75 mL/hr. Has a slight metabolic acidosis with a CO2 of 21, anion gap of 5, chloride level of 113 avoid Nephrotoxic Medications, Contrast Dyes, Hypotension and Dehydration to Ensure Adequate Renal Perfusion and will need to Renally Adjust Meds. CTM and Trend Renal Function carefully and repeat CMP in the AM    Hypokalemia: K+ Trend:  Recent Labs  Lab 08/28/23 0632 09/14/23 1742 09/15/23 0400 09/16/23 0323 09/16/23 1445 09/17/23 2122 09/19/23 1103  K 2.9* 2.8* 3.1* 3.9 3.5 3.0* 3.5  -Takes hydrochlorothiazide  as outpatient which has likely contributed to this; Continue to hold -Replete w/ po KCL 40 mEQ x1. CTM and Replete as Necessary. Repeat CMP in the AM   Hypomagnesemia: Mag Level was 1.5. Replete w/ IV Mag Sulfate 4 grams. CTM and Replete as Necessary. Repeat Mag Level in the AM  Sinus Congestion: Since it happened  in last 24 hours.  Provided Flonase  and montelukast .   Hyponatremia: Improved. Na+ Trend:  Recent Labs  Lab 08/28/23 0632 09/14/23 1742 09/15/23 0400 09/16/23 0323 09/16/23 1445 09/17/23 2122 09/19/23 1103  NA 136 133* 133* 137 137 138 139  -Likely due to Hydrochlorothiazide  in setting of diarrhea and hypovolemia. CTM and Trend and repeat CMP in the  AM  Normocytic Anemia: Likely dilutional Drop from IVF Resuscitation; Hgb/Hct went from 12.6/38.2 -> 13.3/39.2 -> 10.9/35.3. Check Anemia Panel in the AM. CTM for S/Sx of Bleeding; No overt bleeding noted. Repeat CBC in the AM  Hypothyroidism: Check TSH in the AM. C/w Levothyroxine  75 mcg po Daily.  Depression and Anxiety: C/w Escitalopram  10 mg po Daily and Hydroxyzine  25 mg po Daily.   Hypoalbuminemia: Patient's Albumin Lvl Trending down and went from 3.8 -> 3.6 -> 2.5. CTM and Trend and repeat CMP in the AM  Overweight: Complicates overall prognosis and care. Estimated body mass index is 25.4 kg/m as calculated from the following:   Height as of this encounter: 5' 4 (1.626 m).   Weight as of this encounter: 67.1 kg. Weight Loss and Dietary Counseling given   DVT prophylaxis: enoxaparin  (LOVENOX ) injection 40 mg Start: 09/14/23 2215    Code Status: Full Code Family Communication: No family present @ bedside  Disposition Plan:  Level of care: Progressive Status is: Inpatient Remains inpatient appropriate because: No family present @ bedside    Consultants:  Gastroenterology  Procedures:  As delineated as above  Antimicrobials:  Anti-infectives (From admission, onward)    Start     Dose/Rate Route Frequency Ordered Stop   09/16/23 1000  vancomycin  (VANCOCIN ) capsule 125 mg        125 mg Oral 4 times daily 09/16/23 0911 09/26/23 0959       Subjective: Seen and examined at bedside and her diarrhea is improved and she has not had a bowel movement since yesterday.  Feels little bit better but now complaining of sinus congestion.  No more vomiting but still having a little nausea.  Denies any lightheadedness or dizziness and no other concerns or complaints at this time.  Objective: Vitals:   09/18/23 1418 09/18/23 2120 09/19/23 0521 09/19/23 1357  BP: 104/66 114/71 117/75 125/71  Pulse: 65 71 64 69  Resp:  20 18 18   Temp: 98.7 F (37.1 C) 98.9 F (37.2 C) 98.7 F (37.1  C) 98.3 F (36.8 C)  TempSrc: Oral Oral Oral Oral  SpO2: 99% 98% 96% 96%  Weight:      Height:        Intake/Output Summary (Last 24 hours) at 09/19/2023 1420 Last data filed at 09/18/2023 2300 Gross per 24 hour  Intake 1747.7 ml  Output --  Net 1747.7 ml   Filed Weights   09/14/23 1737 09/14/23 2041  Weight: 66.7 kg 67.1 kg   Examination: Physical Exam:  Constitutional: WN/WD, elderly slightly overweight Caucasian female in no acute distress  Respiratory: Diminished to auscultation bilaterally, no wheezing, rales, rhonchi or crackles. Normal respiratory effort and patient is not tachypenic. No accessory muscle use.  Unlabored breathing Cardiovascular: RRR, no murmurs / rubs / gallops. S1 and S2 auscultated. No extremity edema.  Abdomen: Soft, minimally-tender, very slightly-distended. Bowel sounds positive.  GU: Deferred. Musculoskeletal: No clubbing / cyanosis of digits/nails. No joint deformity upper and lower extremities. Skin: No rashes, lesions, ulcers on limited skin evaluation. No induration; Warm and dry.  Neurologic: CN 2-12 grossly intact with no focal deficits.  Romberg sign and cerebellar reflexes not assessed.  Psychiatric: Normal judgment and insight. Alert and oriented x 3. Normal mood and appropriate affect.   Data Reviewed: I have personally reviewed following labs and imaging studies  CBC: Recent Labs  Lab 09/14/23 1742 09/19/23 1103  WBC 10.1 5.1  NEUTROABS  --  2.8  HGB 13.3 10.9*  HCT 39.2 35.3*  MCV 88.9 92.9  PLT 282 157   Basic Metabolic Panel: Recent Labs  Lab 09/14/23 1746 09/15/23 0400 09/16/23 0323 09/16/23 1445 09/17/23 2122 09/19/23 1103  NA  --  133* 137 137 138 139  K  --  3.1* 3.9 3.5 3.0* 3.5  CL  --  106 112* 108 114* 113*  CO2  --  21* 20* 21* 19* 21*  GLUCOSE  --  99 90 91 114* 87  BUN  --  14 14 12 11 8   CREATININE  --  0.96 0.92 0.96 0.76 0.89  CALCIUM   --  8.1* 8.2* 8.2* 8.2* 8.4*  MG 2.0  --   --   --   --  1.5*   PHOS  --   --   --   --   --  2.8   GFR: Estimated Creatinine Clearance: 52.3 mL/min (by C-G formula based on SCr of 0.89 mg/dL). Liver Function Tests: Recent Labs  Lab 09/14/23 1742 09/19/23 1103  AST 26 25  ALT 22 22  ALKPHOS 80 62  BILITOT 0.4 0.3  PROT 6.1* 5.0*  ALBUMIN 3.6 2.5*   Recent Labs  Lab 09/14/23 1742  LIPASE 46   No results for input(s): AMMONIA in the last 168 hours. Coagulation Profile: No results for input(s): INR, PROTIME in the last 168 hours. Cardiac Enzymes: No results for input(s): CKTOTAL, CKMB, CKMBINDEX, TROPONINI in the last 168 hours. BNP (last 3 results) No results for input(s): PROBNP in the last 8760 hours. HbA1C: No results for input(s): HGBA1C in the last 72 hours. CBG: Recent Labs  Lab 09/15/23 1657  GLUCAP 99   Lipid Profile: No results for input(s): CHOL, HDL, LDLCALC, TRIG, CHOLHDL, LDLDIRECT in the last 72 hours. Thyroid  Function Tests: No results for input(s): TSH, T4TOTAL, FREET4, T3FREE, THYROIDAB in the last 72 hours. Anemia Panel: No results for input(s): VITAMINB12, FOLATE, FERRITIN, TIBC, IRON, RETICCTPCT in the last 72 hours. Sepsis Labs: Recent Labs  Lab 09/14/23 1810  LATICACIDVEN 1.1   Recent Results (from the past 240 hours)  Gastrointestinal Panel by PCR , Stool     Status: None   Collection Time: 09/15/23 12:52 PM   Specimen: Stool  Result Value Ref Range Status   Campylobacter species NOT DETECTED NOT DETECTED Final   Plesimonas shigelloides NOT DETECTED NOT DETECTED Final   Salmonella species NOT DETECTED NOT DETECTED Final   Yersinia enterocolitica NOT DETECTED NOT DETECTED Final   Vibrio species NOT DETECTED NOT DETECTED Final   Vibrio cholerae NOT DETECTED NOT DETECTED Final   Enteroaggregative E coli (EAEC) NOT DETECTED NOT DETECTED Final   Enteropathogenic E coli (EPEC) NOT DETECTED NOT DETECTED Final   Enterotoxigenic E coli (ETEC) NOT  DETECTED NOT DETECTED Final   Shiga like toxin producing E coli (STEC) NOT DETECTED NOT DETECTED Final   Shigella/Enteroinvasive E coli (EIEC) NOT DETECTED NOT DETECTED Final   Cryptosporidium NOT DETECTED NOT DETECTED Final   Cyclospora cayetanensis NOT DETECTED NOT DETECTED Final   Entamoeba histolytica NOT DETECTED NOT DETECTED Final   Giardia lamblia NOT DETECTED NOT DETECTED Final   Adenovirus  F40/41 NOT DETECTED NOT DETECTED Final   Astrovirus NOT DETECTED NOT DETECTED Final   Norovirus GI/GII NOT DETECTED NOT DETECTED Final   Rotavirus A NOT DETECTED NOT DETECTED Final   Sapovirus (I, II, IV, and V) NOT DETECTED NOT DETECTED Final    Comment: Performed at [facility], [address], [place], [place] [address]  C Difficile Quick Screen w PCR reflex     Status: Abnormal   Collection Time: 09/15/23 12:52 PM   Specimen: Stool  Result Value Ref Range Status   C Diff antigen POSITIVE (A) NEGATIVE Final   C Diff toxin NEGATIVE NEGATIVE Final   C Diff interpretation Results are indeterminate. See PCR results.  Final    Comment: Performed at [facility], [address], [place], [place] [address]  Calprotectin, Fecal     Status: Abnormal   Collection Time: 09/15/23 12:52 PM   Specimen: Stool  Result Value Ref Range Status   Calprotectin, Fecal 875 (H) 0 - 120 ug/g Final    Comment: (NOTE) **Results verified by repeat testing** Concentration     Interpretation   Follow-Up < 5 - 50 ug/g     Normal           None >50 -120 ug/g     Borderline       Re-evaluate in 4-6 weeks    >120 ug/g     Abnormal         Repeat as clinically                                   indicated Performed At: [facility] [address] [place], [place] [id number] Jennette Shorter MD Ey:[id number]   C. Diff by PCR, Reflexed     Status: Abnormal   Collection Time: 09/15/23 12:52 PM  Result Value Ref Range Status   Toxigenic C. Difficile by PCR POSITIVE (A) NEGATIVE Final     Comment: Positive for toxigenic C. difficile with little to no toxin production. Only treat if clinical presentation suggests symptomatic illness.   Hypervirulent Strain PRESUMPTIVE NEGATIVE PRESUMPTIVE NEGATIVE Final    Comment: Performed at [facility], [address], [place], [place] [address]    Radiology Studies: No results found.  Scheduled Meds:  dicyclomine   10 mg Oral TID AC & HS   enoxaparin  (LOVENOX ) injection  40 mg Subcutaneous QHS   escitalopram   10 mg Oral Daily   fluticasone   1 spray Each Nare Daily   hydrOXYzine   25 mg Oral QHS   levothyroxine   75 mcg Oral Q0600   montelukast   10 mg Oral QHS   ondansetron   4 mg Oral TID AC   potassium chloride   40 mEq Oral Once   vancomycin   125 mg Oral QID   Continuous Infusions:  sodium chloride      magnesium  sulfate bolus IVPB      LOS: 4 days   Alejandro Marker, DO [facility] Available via Epic secure chat 7am-7pm After these hours, please refer to coverage provider listed on [email or web address] 09/19/2023, 2:20 PM

## 2023-09-19 NOTE — Plan of Care (Signed)
  Problem: Clinical Measurements: Goal: Ability to maintain clinical measurements within normal limits will improve Outcome: Progressing Goal: Diagnostic test results will improve Outcome: Progressing Goal: Cardiovascular complication will be avoided Outcome: Progressing   Problem: Clinical Measurements: Goal: Ability to maintain clinical measurements within normal limits will improve Outcome: Progressing Goal: Diagnostic test results will improve Outcome: Progressing Goal: Cardiovascular complication will be avoided Outcome: Progressing   Problem: Elimination: Goal: Will not experience complications related to bowel motility Outcome: Progressing

## 2023-09-19 NOTE — Hospital Course (Addendum)
 Karen Horne is a 73 y.o. 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  Patient was diagnosed with UTI a month ago and treated with Augmentin .  She developed frequent diarrhea afterwards.  She was seen in the ED on 7/29 and GI panel was positive for ETEC.  CT done at that time was showing mild colitis.  She had associated AKI with creatinine 1.78 (baseline 0.7-1.0).  Patient states she is no longer having any nausea or vomiting but continues to have frequent non-bloody diarrhea.  She is having diarrhea about 6 times a day.  She is trying to keep herself hydrated by drinking 2 L fluids every day.  She reports some mild upper abdominal discomfort every time she goes to the bathroom to have a bowel movement but not having any abdominal pain or discomfort otherwise.    **Interim History Found to have C. difficile colitis and diarrhea and was placed on oral vancomycin  with slow improvement.  She was given IV fluid hydration and AKI is improved.  Electrolytes are still off in the setting of her diarrhea  Assessment and Plan:  C Diff Colitis and Diarrhea: Had recent ETEC and repeat GI pathogen panel was negative.  Fecal calprotectin was 875, C. difficile antigen was positive and toxigenic C. difficile by PCR was positive and fecal lactoferrin qualitative was positive C/w po Vancomycin  125 mg po 4x Daily. No Imodium d/t C Diff. IVF hydration as below. C/w Supportive Care and po Ondansetron  4 mg po TID before meals and Dicyclomine  10 mg AC/HS. Diarrhea is slowing down.  GI was consulted earlier this admission but have signed off the case and recommending holding endoscopic evaluation for now and recommends continuing current therapy  with oral vancomycin  and recommending considering Fidaxomicin in the future and if she has a bout of potential recurrence and they recommend considering Vowst/Rebyota.    Orthostatic Hypotension due to Dehydration from C Diff Colitis and Diarrhea: Due to above; IVF as below. Repeat Orthostatic VS done and she did not drop   AKI / Metabolic Acidosis: Improved. BUN/Cr Trend: Recent Labs  Lab 09/14/23 1742 09/15/23 0400 09/16/23 0323 09/16/23 1445 09/17/23 2122 09/19/23 1103 09/20/23 0431  BUN 16 14 14 12 11 8  <5*  CREATININE 1.41* 0.96 0.92 0.96 0.76 0.89 0.87  -Decrease IVF with NS @ 125 mL/hr -> 75 mL/hr. Has a slight metabolic acidosis with a CO2 of 21, anion gap of 5, chloride level of 113 avoid Nephrotoxic Medications, Contrast Dyes, Hypotension and Dehydration to Ensure Adequate Renal Perfusion and will need to Renally Adjust Meds. CTM and Trend Renal Function carefully and repeat CMP in the AM    Hypokalemia: K+ Trend:  Recent Labs  Lab 09/14/23 1742 09/15/23 0400 09/16/23 0323 09/16/23 1445 09/17/23 2122 09/19/23 1103 09/20/23 0431  K 2.8* 3.1* 3.9 3.5 3.0* 3.5 3.8  -Takes hydrochlorothiazide  as outpatient which has likely contributed to this; Continue to hold -Replete w/ po KCL 40 mEQ x1. CTM and Replete as Necessary. Repeat CMP within 1 week  Hypomagnesemia: Mag Level was 1.5 and improved to 2.2. Replete w/ IV Mag Sulfate 4 grams yesterday. CTM and Replete as Necessary. Repeat Mag Level in the AM  Sinus Congestion: Since it happened in last 24 hours.  Provided Flonase  and montelukast .  Will initiate doxycycline   for 5 days and provide her probiotics.  Continue supportive care   Hyponatremia: Improved. Na+ Trend:  Recent Labs  Lab 09/14/23 1742 09/15/23 0400 09/16/23 0323 09/16/23 1445 09/17/23 2122 09/19/23 1103 09/20/23 0431  NA 133* 133* 137 137 138 139 136  -Likely due to Hydrochlorothiazide  in setting of diarrhea and hypovolemia.  Will discontinue  hydrochlorothiazide  at discharge CTM and Trend and repeat CMP within 1 week  Normocytic Anemia: Likely dilutional Drop from IVF Resuscitation; Hgb/Hct went from 12.6/38.2 -> 13.3/39.2 -> 10.9/35.3 -> 10.4/32.1. Check Anemia Panel in the AM. CTM for S/Sx of Bleeding; No overt bleeding noted. Repeat CBC within 1 week  Hypothyroidism: Check TSH in the AM. C/w Levothyroxine  75 mcg po Daily.  Depression and Anxiety: C/w Escitalopram  10 mg po Daily and Hydroxyzine  25 mg po Daily.   Hypoalbuminemia: Patient's Albumin Lvl Trending down and went from 3.8 -> 3.6 -> 2.5 and is 2.2 at the time of discharge. CTM and Trend and repeat CMP within 1 week  Overweight: Complicates overall prognosis and care. Estimated body mass index is 25.4 kg/m as calculated from the following:   Height as of this encounter: 5' 4 (1.626 m).   Weight as of this encounter: 67.1 kg. Weight Loss and Dietary Counseling given

## 2023-09-20 DIAGNOSIS — A09 Infectious gastroenteritis and colitis, unspecified: Secondary | ICD-10-CM | POA: Diagnosis not present

## 2023-09-20 DIAGNOSIS — A0472 Enterocolitis due to Clostridium difficile, not specified as recurrent: Secondary | ICD-10-CM | POA: Diagnosis not present

## 2023-09-20 DIAGNOSIS — I951 Orthostatic hypotension: Secondary | ICD-10-CM | POA: Diagnosis not present

## 2023-09-20 DIAGNOSIS — N179 Acute kidney failure, unspecified: Secondary | ICD-10-CM | POA: Diagnosis not present

## 2023-09-20 LAB — CBC WITH DIFFERENTIAL/PLATELET
Abs Immature Granulocytes: 0.03 K/uL (ref 0.00–0.07)
Basophils Absolute: 0 K/uL (ref 0.0–0.1)
Basophils Relative: 1 %
Eosinophils Absolute: 0.2 K/uL (ref 0.0–0.5)
Eosinophils Relative: 3 %
HCT: 32.1 % — ABNORMAL LOW (ref 36.0–46.0)
Hemoglobin: 10.4 g/dL — ABNORMAL LOW (ref 12.0–15.0)
Immature Granulocytes: 1 %
Lymphocytes Relative: 21 %
Lymphs Abs: 1.3 K/uL (ref 0.7–4.0)
MCH: 30.4 pg (ref 26.0–34.0)
MCHC: 32.4 g/dL (ref 30.0–36.0)
MCV: 93.9 fL (ref 80.0–100.0)
Monocytes Absolute: 0.5 K/uL (ref 0.1–1.0)
Monocytes Relative: 8 %
Neutro Abs: 4.2 K/uL (ref 1.7–7.7)
Neutrophils Relative %: 66 %
Platelets: 136 K/uL — ABNORMAL LOW (ref 150–400)
RBC: 3.42 MIL/uL — ABNORMAL LOW (ref 3.87–5.11)
RDW: 14.4 % (ref 11.5–15.5)
WBC: 6.1 K/uL (ref 4.0–10.5)
nRBC: 0 % (ref 0.0–0.2)

## 2023-09-20 LAB — COMPREHENSIVE METABOLIC PANEL WITH GFR
ALT: 20 U/L (ref 0–44)
AST: 22 U/L (ref 15–41)
Albumin: 2.2 g/dL — ABNORMAL LOW (ref 3.5–5.0)
Alkaline Phosphatase: 60 U/L (ref 38–126)
Anion gap: 4 — ABNORMAL LOW (ref 5–15)
BUN: <5 mg/dL — ABNORMAL LOW (ref 8–23)
CO2: 22 mmol/L (ref 22–32)
Calcium: 8.1 mg/dL — ABNORMAL LOW (ref 8.9–10.3)
Chloride: 110 mmol/L (ref 98–111)
Creatinine, Ser: 0.87 mg/dL (ref 0.44–1.00)
GFR, Estimated: >60 mL/min (ref >60)
Glucose, Bld: 89 mg/dL (ref 70–99)
Potassium: 3.8 mmol/L (ref 3.5–5.1)
Sodium: 136 mmol/L (ref 135–145)
Total Bilirubin: 0.2 mg/dL (ref 0.0–1.2)
Total Protein: 4.6 g/dL — ABNORMAL LOW (ref 6.5–8.1)

## 2023-09-20 LAB — MAGNESIUM: Magnesium: 2.2 mg/dL (ref 1.7–2.4)

## 2023-09-20 LAB — TSH: TSH: 0.54 u[IU]/mL (ref 0.350–4.500)

## 2023-09-20 LAB — PHOSPHORUS: Phosphorus: 2.3 mg/dL — ABNORMAL LOW (ref 2.5–4.6)

## 2023-09-20 MED ORDER — ACETAMINOPHEN 325 MG PO TABS: 650.0000 mg | ORAL_TABLET | Freq: Four times a day (QID) | ORAL | 0 refills | Status: AC | PRN

## 2023-09-20 MED ORDER — SALINE SPRAY 0.65 % NA SOLN: 1.0000 | NASAL | 0 refills | Status: AC | PRN

## 2023-09-20 MED ORDER — DOXYCYCLINE HYCLATE 100 MG PO TABS
100.0000 mg | ORAL_TABLET | Freq: Two times a day (BID) | ORAL | Status: DC
  Administered 2023-09-20: 100 mg via ORAL
  Filled 2023-09-20: qty 1

## 2023-09-20 MED ORDER — POTASSIUM CHLORIDE ER 10 MEQ PO TBCR: 10.0000 meq | EXTENDED_RELEASE_TABLET | Freq: Every day | ORAL | 0 refills | Status: AC

## 2023-09-20 MED ORDER — SACCHAROMYCES BOULARDII 250 MG PO CAPS: 250.0000 mg | ORAL_CAPSULE | Freq: Two times a day (BID) | ORAL | 0 refills | Status: DC

## 2023-09-20 MED ORDER — SIMETHICONE 80 MG PO CHEW: 80.0000 mg | CHEWABLE_TABLET | Freq: Four times a day (QID) | ORAL | Status: DC | PRN

## 2023-09-20 MED ORDER — MONTELUKAST SODIUM 10 MG PO TABS: 10.0000 mg | ORAL_TABLET | Freq: Every day | ORAL | 0 refills | Status: AC

## 2023-09-20 MED ORDER — ONDANSETRON 4 MG PO TBDP: 4.0000 mg | ORAL_TABLET | Freq: Three times a day (TID) | ORAL | 0 refills | Status: AC | PRN

## 2023-09-20 MED ORDER — K PHOS MONO-SOD PHOS DI & MONO 155-852-130 MG PO TABS
500.0000 mg | ORAL_TABLET | Freq: Two times a day (BID) | ORAL | Status: DC
  Administered 2023-09-20: 500 mg via ORAL
  Filled 2023-09-20: qty 2

## 2023-09-20 MED ORDER — SIMETHICONE 80 MG PO CHEW: 80.0000 mg | CHEWABLE_TABLET | Freq: Four times a day (QID) | ORAL | 0 refills | Status: AC | PRN

## 2023-09-20 MED ORDER — VANCOMYCIN HCL 125 MG PO CAPS: 125.0000 mg | ORAL_CAPSULE | Freq: Four times a day (QID) | ORAL | 0 refills | Status: AC

## 2023-09-20 MED ORDER — SACCHAROMYCES BOULARDII 250 MG PO CAPS
250.0000 mg | ORAL_CAPSULE | Freq: Two times a day (BID) | ORAL | Status: DC
  Administered 2023-09-20: 250 mg via ORAL
  Filled 2023-09-20: qty 1

## 2023-09-20 MED ORDER — DOXYCYCLINE HYCLATE 100 MG PO TABS: 100.0000 mg | ORAL_TABLET | Freq: Two times a day (BID) | ORAL | 0 refills | Status: AC

## 2023-09-20 MED ORDER — LISINOPRIL 10 MG PO TABS: 10.0000 mg | ORAL_TABLET | Freq: Every day | ORAL | 0 refills | Status: AC

## 2023-09-20 MED ORDER — FLUTICASONE PROPIONATE 50 MCG/ACT NA SUSP
2.0000 | Freq: Every day | NASAL | Status: DC
  Filled 2023-09-20: qty 16

## 2023-09-20 NOTE — Plan of Care (Signed)
  Problem: Education: Goal: Knowledge of General Education information will improve Description: Including pain rating scale, medication(s)/side effects and non-pharmacologic comfort measures Outcome: Progressing   Problem: Clinical Measurements: Goal: Diagnostic test results will improve Outcome: Progressing Goal: Cardiovascular complication will be avoided Outcome: Progressing   Problem: Activity: Goal: Risk for activity intolerance will decrease Outcome: Progressing   Problem: Nutrition: Goal: Adequate nutrition will be maintained Outcome: Progressing   Problem: Coping: Goal: Level of anxiety will decrease Outcome: Progressing   Problem: Pain Managment: Goal: General experience of comfort will improve and/or be controlled Outcome: Progressing   Problem: Safety: Goal: Ability to remain free from injury will improve Outcome: Progressing

## 2023-09-20 NOTE — Evaluation (Signed)
 Physical Therapy One Time Evaluation Patient Details Name: Karen Horne MRN: [id number] DOB: [birth year] Today's Date: 09/20/2023  History of Present Illness  73 y.o. female with presenting with persistent diarrhea and found to have C Diff Colitis and Diarrhea.  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 All education has been completed and the patient has no further questions. Pt able to ambulate good distance in hallway and does not require assistive device.  RN reports pt has been up in her room ad lib. No further follow-up Physical Therapy or equipment needs. PT is signing off. Thank you for this referral.         If plan is discharge home, recommend the following:     Can travel by private vehicle        Equipment Recommendations None recommended by PT  Recommendations for Other Services       Functional Status Assessment Patient has not had a recent decline in their functional status     Precautions / Restrictions Precautions Precautions: None      Mobility  Bed Mobility Overal bed mobility: Modified Independent                  Transfers Overall transfer level: Modified independent                      Ambulation/Gait Ambulation/Gait assistance: Modified independent (Device/Increase time) Gait Distance (Feet): 300 Feet Assistive device: None Gait Pattern/deviations: Antalgic, Decreased stance time - right       General Gait Details: mildly antalgic gait due to needing R TKA per pt report however no assistive device or UE support needed, no LOB or unsteadiness observed  Stairs             Wheelchair Mobility     Tilt Bed    Modified Rankin (Stroke Patients Only)       Balance Overall balance assessment: No apparent balance deficits (not formally assessed)                                           Pertinent Vitals/Pain Pain Assessment Pain Assessment: No/denies pain    Home Living Family/patient expects to be discharged to:: Private residence Living Arrangements: Spouse/significant other Available Help at Discharge: Family;Available 24 hours/day Type of Home: House Home Access: Stairs to enter Entrance Stairs-Rails: Can reach both Entrance Stairs-Number of Steps: 5   Home Layout: One level Home Equipment: Agricultural consultant (2 wheels);Cane - single point;Rollator (4 wheels);Shower seat;Grab bars - tub/shower Additional Comments: Pt lives with husband    Prior Function Prior Level of Function : Independent/Modified Independent                     Extremity/Trunk Assessment   Upper Extremity Assessment Upper Extremity Assessment: RUE deficits/detail RUE Deficits / Details: Pt reports RTC problems, able to flex shoulder to 90 degrees, then AAROM through full ROM. good overall strength and does not impede ADLs    Lower Extremity Assessment Lower Extremity Assessment: RLE deficits/detail;Overall [facility] for tasks assessed RLE Deficits / Details: reports she needs a  TKA, but functional movement overall observed (hx of L TKA as well)       Communication   Communication Communication: No apparent difficulties    Cognition Arousal: Alert Behavior During Therapy: WFL for tasks assessed/performed   PT - Cognitive impairments: No apparent impairments                         Following commands: Intact       Cueing Cueing Techniques: Verbal cues     General Comments      Exercises     Assessment/Plan    PT Assessment Patient does not need any further PT services  PT Problem List         PT Treatment  Interventions      PT Goals (Current goals can be found in the Care Plan section)  Acute Rehab PT Goals PT Goal Formulation: All assessment and education complete, DC therapy    Frequency       Co-evaluation               AM-PAC PT 6 Clicks Mobility  Outcome Measure Help needed turning from your back to your side while in a flat bed without using bedrails?: None Help needed moving from lying on your back to sitting on the side of a flat bed without using bedrails?: None Help needed moving to and from a bed to a chair (including a wheelchair)?: None Help needed standing up from a chair using your arms (e.g., wheelchair or bedside chair)?: None Help needed to walk in hospital room?: None Help needed climbing 3-5 steps with a railing? : None 6 Click Score: 24    End of Session   Activity Tolerance: Patient tolerated treatment well Patient left: in bed;with call bell/phone within reach (with MD) Nurse Communication: Mobility status PT Visit Diagnosis: Difficulty in walking, not elsewhere classified (R26.2)    Time: 1055-1105 PT Time Calculation (min) (ACUTE ONLY): 10 min   Charges:   PT Evaluation $PT Eval Low Complexity: 1 Low   PT General Charges $$ ACUTE PT VISIT: 1 Visit    Tari KLEIN, DPT Physical Therapist Acute Rehabilitation Services Office: [phone]   Tari CROME [place] 09/20/2023, 11:47 AM

## 2023-09-20 NOTE — Plan of Care (Incomplete)
  Problem: Health Behavior/Discharge Planning: Goal: Ability to manage health-related needs will improve Outcome: Progressing   Problem: Clinical Measurements: Goal: Ability to maintain clinical measurements within normal limits will improve Outcome: Progressing Goal: Will remain free from infection Outcome: Progressing Goal: Diagnostic test results will improve Outcome: Progressing   Problem: Nutrition: Goal: Adequate nutrition will be maintained Outcome: Progressing   Problem: Coping: Goal: Level of anxiety will decrease Outcome: Progressing   Problem: Pain Managment: Goal: General experience of comfort will improve and/or be controlled Outcome: Progressing   Problem: Education: Goal: Knowledge of General Education information will improve Description: Including pain rating scale, medication(s)/side effects and non-pharmacologic comfort measures Outcome: Adequate for Discharge   Problem: Clinical Measurements: Goal: Respiratory complications will improve Outcome: Adequate for Discharge Goal: Cardiovascular complication will be avoided Outcome: Adequate for Discharge   Problem: Activity: Goal: Risk for activity intolerance will decrease Outcome: Adequate for Discharge   Problem: Elimination: Goal: Will not experience complications related to bowel motility Outcome: Adequate for Discharge Goal: Will not experience complications related to urinary retention Outcome: Adequate for Discharge   Problem: Safety: Goal: Ability to remain free from injury will improve Outcome: Adequate for Discharge   Problem: Skin Integrity: Goal: Risk for impaired skin integrity will decrease Outcome: Adequate for Discharge

## 2023-09-20 NOTE — Evaluation (Signed)
 Occupational Therapy Evaluation Patient Details Name: Karen Horne MRN: [id number] DOB: May 14, 1950 Today's Date: 09/20/2023   History of Present Illness   73 y.o. female with presenting with persistent diarrhea and found to have C Diff Colitis and Diarrhea.  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 Pt live at home with husband, 5 STE, PLOF ind without AD. Pt currently close to baseline, independent with ADLs and mobility around room, at this time has no further acute OT or follow up needs. Signing off.      If plan is discharge home, recommend the following:         Functional Status Assessment   Patient has had a recent decline in their functional status and demonstrates the ability to make significant improvements in function in a reasonable and predictable amount of time.     Equipment Recommendations   None recommended by OT     Recommendations for Other Services         Precautions/Restrictions   Precautions Precautions: None Restrictions Weight Bearing Restrictions Per Provider Order: No     Mobility Bed Mobility Overal bed mobility: Independent                  Transfers Overall transfer level: Independent                        Balance Overall balance assessment: No apparent balance deficits (not formally assessed)                                         ADL either performed or assessed with clinical judgement   ADL Overall ADL's : Independent                                             Vision Baseline Vision/History: 1 Wears glasses Ability to  See in Adequate Light: 0 Adequate Patient Visual Report: No change from baseline       Perception         Praxis         Pertinent Vitals/Pain Pain Assessment Pain Assessment: No/denies pain     Extremity/Trunk Assessment Upper Extremity Assessment Upper Extremity Assessment: RUE deficits/detail RUE Deficits / Details: Pt reports RTC problems, able to flex shoulder to 90 degrees, then AAROM through full ROM. good overall strength and does not impede ADLs   Lower Extremity Assessment Lower Extremity Assessment: RLE deficits/detail;Overall WFL for tasks assessed RLE Deficits / Details: reports she needs a TKA, but functional movement overall observed (hx of L TKA as well)       Communication Communication Communication: No apparent difficulties   Cognition Arousal: Alert Behavior During Therapy: WFL for tasks assessed/performed Cognition: No apparent impairments                               Following commands: Intact       Cueing  General Comments   Cueing  Techniques: Verbal cues      Exercises     Shoulder Instructions      Home Living Family/patient expects to be discharged to:: Private residence Living Arrangements: Spouse/significant other Available Help at Discharge: Family;Available 24 hours/day Type of Home: House Home Access: Stairs to enter [employer] of Steps: 5 Entrance Stairs-Rails: Can reach both Home Layout: One level     Bathroom Shower/Tub: Walk-in shower;Other (comment) (walk in tub)         Home Equipment: Rolling Walker (2 wheels);Cane - single point;Rollator (4 wheels);Shower seat;Grab bars - tub/shower   Additional Comments: Pt lives with husband      Prior Functioning/Environment Prior Level of Function : Independent/Modified Independent                    OT Problem List: Decreased activity tolerance   OT Treatment/Interventions:        OT Goals(Current goals can be found in the  care plan section)   Acute Rehab OT Goals Patient Stated Goal: to return home OT Goal Formulation: With patient Time For Goal Achievement: 10/04/23 Potential to Achieve Goals: Good   OT Frequency:       Co-evaluation              AM-PAC OT 6 Clicks Daily Activity     Outcome Measure Help from another person eating meals?: None Help from another person taking care of personal grooming?: None Help from another person toileting, which includes using toliet, bedpan, or urinal?: None Help from another person bathing (including washing, rinsing, drying)?: None Help from another person to put on and taking off regular upper body clothing?: None Help from another person to put on and taking off regular lower body clothing?: None 6 Click Score: 24   End of Session Nurse Communication: Mobility status  Activity Tolerance: Patient tolerated treatment well Patient left: in bed;with call bell/phone within reach  OT Visit Diagnosis: Muscle weakness (generalized) (M62.81)                Time: 8869-8855 OT Time Calculation (min): 14 min Charges:  OT General Charges $OT Visit: 1 Visit OT Evaluation $OT Eval Low Complexity: 1 Low  [address], OTR/L   Elouise JONELLE Bott 09/20/2023, 11:53 AM

## 2023-09-22 NOTE — Discharge Summary (Signed)
 Physician Discharge Summary   Patient: Karen Horne MRN: [id number] DOB: Jul 23, 1950  Admit date:     09/14/2023  Discharge date: 09/20/2023  Discharge Physician: Alejandro Marker, DO   PCP: Haze Kingfisher, MD   Recommendations at discharge:   Follow-up with PCP within 1 to 2 weeks repeat CBC, CMP, mag, Phos within 1 week Follow-up with gastroenterology in outpatient setting within 1 to 2 weeks  Discharge Diagnoses: Principal Problem:   Diarrhea Active Problems:   Orthostatic hypotension   AKI (acute kidney injury) (HCC)   Hypokalemia   Hyponatremia   C. difficile colitis  Resolved Problems:   * No resolved hospital problems. *  Hospital Course: Karen Horne is a 73 y.o. 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  Patient was diagnosed with UTI a month ago and treated with Augmentin .  She developed frequent diarrhea afterwards.  She was seen in the ED on 7/29 and GI panel was positive for ETEC.  CT done at that time was showing mild colitis.  She had associated AKI with creatinine 1.78 (baseline 0.7-1.0).  Patient states she is no longer having any nausea or vomiting but continues to have frequent non-bloody diarrhea.  She is having diarrhea about 6 times a day.  She is trying to keep herself hydrated by drinking 2 L fluids every day.  She reports some mild upper abdominal discomfort every time she goes to the bathroom to have a bowel movement but not having any abdominal pain or discomfort otherwise.    **Interim History Found to have C. difficile colitis and diarrhea and was placed on oral vancomycin  with slow improvement.  She was given IV fluid hydration and AKI is improved.  Electrolytes are still  off in the setting of her diarrhea  Assessment and Plan:  C Diff Colitis and Diarrhea: Had recent ETEC and repeat GI pathogen panel was negative.  Fecal calprotectin was 875, C. difficile antigen was positive and toxigenic C. difficile by PCR was positive and fecal lactoferrin qualitative was positive C/w po Vancomycin  125 mg po 4x Daily. No Imodium d/t C Diff. IVF hydration as below. C/w Supportive Care and po Ondansetron  4 mg po TID before meals and Dicyclomine  10 mg AC/HS. Diarrhea is slowing down.  GI was consulted earlier this admission but have signed off the case and recommending holding endoscopic evaluation for now and recommends continuing current therapy with oral vancomycin  and recommending considering Fidaxomicin in the future and if she has a bout of potential recurrence and they recommend considering Vowst/Rebyota.    Orthostatic Hypotension due to Dehydration from C Diff Colitis and Diarrhea: Due to above; IVF as below. Repeat Orthostatic VS done and she did not drop   AKI / Metabolic Acidosis: Improved. BUN/Cr Trend: Recent Labs  Lab 09/14/23 1742 09/15/23 0400 09/16/23 0323 09/16/23 1445 09/17/23 2122 09/19/23 1103 09/20/23 0431  BUN 16 14 14 12 11 8  <5*  CREATININE 1.41* 0.96 0.92 0.96 0.76 0.89 0.87  -Decrease IVF with NS @ 125 mL/hr -> 75 mL/hr. Has a slight metabolic acidosis with a CO2 of 21, anion gap of 5, chloride level of 113 avoid Nephrotoxic Medications, Contrast Dyes, Hypotension and Dehydration to Ensure Adequate Renal Perfusion and will need to Renally Adjust Meds. CTM and Trend Renal Function carefully and repeat  CMP in the AM    Hypokalemia: K+ Trend:  Recent Labs  Lab 09/14/23 1742 09/15/23 0400 09/16/23 0323 09/16/23 1445 09/17/23 2122 09/19/23 1103 09/20/23 0431  K 2.8* 3.1* 3.9 3.5 3.0* 3.5 3.8  -Takes hydrochlorothiazide  as outpatient which has likely contributed to this; Continue to hold -Replete w/ po KCL 40 mEQ x1. CTM and Replete as  Necessary. Repeat CMP within 1 week  Hypomagnesemia: Mag Level was 1.5 and improved to 2.2. Replete w/ IV Mag Sulfate 4 grams yesterday. CTM and Replete as Necessary. Repeat Mag Level in the AM  Sinus Congestion: Since it happened in last 24 hours.  Provided Flonase  and montelukast .  Will initiate doxycycline  for 5 days and provide her probiotics.  Continue supportive care   Hyponatremia: Improved. Na+ Trend:  Recent Labs  Lab 09/14/23 1742 09/15/23 0400 09/16/23 0323 09/16/23 1445 09/17/23 2122 09/19/23 1103 09/20/23 0431  NA 133* 133* 137 137 138 139 136  -Likely due to Hydrochlorothiazide  in setting of diarrhea and hypovolemia.  Will discontinue hydrochlorothiazide  at discharge CTM and Trend and repeat CMP within 1 week  Normocytic Anemia: Likely dilutional Drop from IVF Resuscitation; Hgb/Hct went from 12.6/38.2 -> 13.3/39.2 -> 10.9/35.3 -> 10.4/32.1. Check Anemia Panel in the AM. CTM for S/Sx of Bleeding; No overt bleeding noted. Repeat CBC within 1 week  Hypothyroidism: Check TSH in the AM. C/w Levothyroxine  75 mcg po Daily.  Depression and Anxiety: C/w Escitalopram  10 mg po Daily and Hydroxyzine  25 mg po Daily.   Hypoalbuminemia: Patient's Albumin Lvl Trending down and went from 3.8 -> 3.6 -> 2.5 and is 2.2 at the time of discharge. CTM and Trend and repeat CMP within 1 week  Overweight: Complicates overall prognosis and care. Estimated body mass index is 25.4 kg/m as calculated from the following:   Height as of this encounter: 5' 4 (1.626 m).   Weight as of this encounter: 67.1 kg. Weight Loss and Dietary Counseling given  Consultants: Gastroenterology Procedures performed: As delineated as above  Disposition: Home health Diet recommendation:  Discharge Diet Orders (From admission, onward)     Start     Ordered   09/20/23 0000  Diet general        09/20/23 1333           Cardiac diet DISCHARGE MEDICATION: Allergies as of 09/20/2023       Reactions    Adhesive [tape] Rash        Medication List     STOP taking these medications    dicyclomine  10 MG capsule Commonly known as: BENTYL    lisinopril -hydrochlorothiazide  10-12.5 MG tablet Commonly known as: ZESTORETIC    sucralfate  1 GM/10ML suspension Commonly known as: CARAFATE        TAKE these medications    acetaminophen  325 MG tablet Commonly known as: TYLENOL  Take 2 tablets (650 mg total) by mouth every 6 (six) hours as needed for mild pain (pain score 1-3) or fever (or Fever >/= 101).   cyanocobalamin 1000 MCG tablet Take 1,000 mcg by mouth daily.   doxycycline  100 MG tablet Commonly known as: VIBRA -TABS Take 1 tablet (100 mg total) by mouth every 12 (twelve) hours for 5 days.   escitalopram  10 MG tablet Commonly known as: LEXAPRO  Take 1 tablet by mouth daily.   ezetimibe 10 MG tablet Commonly known as: ZETIA Take 1 tablet by mouth daily.   fluticasone  50 MCG/ACT nasal spray Commonly known as: FLONASE  Place 1 spray into both nostrils daily.   levothyroxine  75 MCG  tablet Commonly known as: SYNTHROID  Take 75 mcg by mouth daily before breakfast.   lisinopril  10 MG tablet Commonly known as: ZESTRIL  Take 1 tablet (10 mg total) by mouth daily.   montelukast  10 MG tablet Commonly known as: SINGULAIR  Take 1 tablet (10 mg total) by mouth at bedtime.   omeprazole  40 MG capsule Commonly known as: PRILOSEC Take 1 capsule (40 mg total) by mouth 2 (two) times daily. For 12 weeks   ondansetron  4 MG disintegrating tablet Commonly known as: ZOFRAN -ODT Take 1 tablet (4 mg total) by mouth every 8 (eight) hours as needed for nausea or vomiting.   potassium chloride  10 MEQ tablet Commonly known as: KLOR-CON  Take 1 tablet (10 mEq total) by mouth daily for 7 days.   saccharomyces boulardii 250 MG capsule Commonly known as: FLORASTOR Take 1 capsule (250 mg total) by mouth 2 (two) times daily.   simethicone  80 MG chewable tablet Commonly known as: MYLICON Chew 1  tablet (80 mg total) by mouth 4 (four) times daily as needed for flatulence.   sodium chloride  0.65 % Soln nasal spray Commonly known as: OCEAN Place 1 spray into both nostrils as needed for congestion.   vancomycin  125 MG capsule Commonly known as: VANCOCIN  Take 1 capsule (125 mg total) by mouth 4 (four) times daily for 5 days.        Follow-up Information     May, Deanna J, NP Follow up on 10/11/2023.   Specialty: Gastroenterology Why: 3:00 pm Contact information: [address] [place] [address] [phone]                Discharge Exam: Karen Horne   09/14/23 1737 09/14/23 2041  Weight: 66.7 kg 67.1 kg   Vitals:   09/20/23 0522 09/20/23 1331  BP: 127/72 122/67  Pulse: 83 65  Resp: 15 18  Temp: 99.7 F (37.6 C) 98.2 F (36.8 C)  SpO2: 95% 95%   Examination: Physical Exam:  Constitutional: WN/WD, NAD and appears calm and comfortable Eyes: PERRL, lids and conjunctivae normal, sclerae anicteric  ENMT: External Ears, Nose appear normal. Grossly normal hearing. Mucous membranes are moist. Posterior pharynx clear of any exudate or lesions. Normal dentition.  Neck: Appears normal, supple, no cervical masses, normal ROM, no appreciable thyromegaly Respiratory: Clear to auscultation bilaterally, no wheezing, rales, rhonchi or crackles. Normal respiratory effort and patient is not tachypenic. No accessory muscle use.  Cardiovascular: RRR, no murmurs / rubs / gallops. S1 and S2 auscultated. No extremity edema. 2+ pedal pulses. No carotid bruits.  Abdomen: Soft, non-tender, non-distended. No masses palpated. No appreciable hepatosplenomegaly. Bowel sounds positive.  GU: Deferred. Musculoskeletal: No clubbing / cyanosis of digits/nails. No joint deformity upper and lower extremities. Good ROM, no contractures. Normal strength and muscle tone.  Skin: No rashes, lesions, ulcers. No induration; Warm and dry.  Neurologic: CN 2-12 grossly intact with no focal  deficits. Sensation intact in all 4 Extremities, DTR normal. Strength 5/5 in all 4. Romberg sign cerebellar reflexes not assessed.  Psychiatric: Normal judgment and insight. Alert and oriented x 3. Normal mood and appropriate affect.    Condition at discharge: stable  The results of significant diagnostics from this hospitalization (including imaging, microbiology, ancillary and laboratory) are listed below for reference.   Imaging Studies: CT ABDOMEN PELVIS WO CONTRAST Result Date: 08/28/2023 CLINICAL DATA:  Nausea, vomiting, and diarrhea EXAM: CT ABDOMEN AND PELVIS WITHOUT CONTRAST TECHNIQUE: Multidetector CT imaging of the abdomen and pelvis was performed following the standard  protocol without IV contrast. RADIATION DOSE REDUCTION: This exam was performed according to the departmental dose-optimization program which includes automated exposure control, adjustment of the mA and/or kV according to patient size and/or use of iterative reconstruction technique. COMPARISON:  CT abdomen and pelvis dated 05/13/2023 FINDINGS: Lower chest: No focal consolidation or pulmonary nodule in the lung bases. No pleural effusion or pneumothorax demonstrated. Partially imaged heart size is normal. Hepatobiliary: No focal hepatic lesions. No intra or extrahepatic biliary ductal dilation. Normal gallbladder. Pancreas: No focal lesions or main ductal dilation. Spleen: Normal in size without focal abnormality. Adrenals/Urinary Tract: No adrenal nodules. No suspicious renal mass on this noncontrast enhanced examination, calculi or hydronephrosis. Unchanged 16 mm left upper pole simple cyst. No specific follow-up imaging recommended. No focal bladder wall thickening. Stomach/Bowel: Small hiatal hernia. Surgical clips along the gastroesophageal junction. No abnormal bowel dilation or mural thickening. Small focus of pericolonic stranding in the distal descending colon (4:45). appendix is not discretely seen. Vascular/Lymphatic:  Aortic atherosclerosis. No enlarged abdominal or pelvic lymph nodes. Reproductive: No adnexal masses. Other: No free fluid, fluid collection, or free air. Musculoskeletal: No acute or abnormal lytic or blastic osseous lesions. Multilevel degenerative changes of the partially imaged thoracic and lumbar spine. Unchanged grade 1 anterolisthesis at L4-5. IMPRESSION: 1. Small focus of pericolonic stranding in the distal descending colon, which may represent mild colitis. 2. Small hiatal hernia. 3.  Aortic Atherosclerosis (ICD10-I70.0). Electronically Signed   By: Limin  Xu M.D.   On: 08/28/2023 10:24    Microbiology: Results for orders placed or performed during the hospital encounter of 09/14/23  Gastrointestinal Panel by PCR , Stool     Status: None   Collection Time: 09/15/23 12:52 PM   Specimen: Stool  Result Value Ref Range Status   Campylobacter species NOT DETECTED NOT DETECTED Final   Plesimonas shigelloides NOT DETECTED NOT DETECTED Final   Salmonella species NOT DETECTED NOT DETECTED Final   Yersinia enterocolitica NOT DETECTED NOT DETECTED Final   Vibrio species NOT DETECTED NOT DETECTED Final   Vibrio cholerae NOT DETECTED NOT DETECTED Final   Enteroaggregative E coli (EAEC) NOT DETECTED NOT DETECTED Final   Enteropathogenic E coli (EPEC) NOT DETECTED NOT DETECTED Final   Enterotoxigenic E coli (ETEC) NOT DETECTED NOT DETECTED Final   Shiga like toxin producing E coli (STEC) NOT DETECTED NOT DETECTED Final   Shigella/Enteroinvasive E coli (EIEC) NOT DETECTED NOT DETECTED Final   Cryptosporidium NOT DETECTED NOT DETECTED Final   Cyclospora cayetanensis NOT DETECTED NOT DETECTED Final   Entamoeba histolytica NOT DETECTED NOT DETECTED Final   Giardia lamblia NOT DETECTED NOT DETECTED Final   Adenovirus F40/41 NOT DETECTED NOT DETECTED Final   Astrovirus NOT DETECTED NOT DETECTED Final   Norovirus GI/GII NOT DETECTED NOT DETECTED Final   Rotavirus A NOT DETECTED NOT DETECTED Final    Sapovirus (I, II, IV, and V) NOT DETECTED NOT DETECTED Final    Comment: Performed at [facility], [address] [place], [place] [address]  C Difficile Quick Screen w PCR reflex     Status: Abnormal   Collection Time: 09/15/23 12:52 PM   Specimen: Stool  Result Value Ref Range Status   C Diff antigen POSITIVE (A) NEGATIVE Final   C Diff toxin NEGATIVE NEGATIVE Final   C Diff interpretation Results are indeterminate. See PCR results.  Final    Comment: Performed at [facility], [address] [place], [place] [address]  Calprotectin, Fecal  Status: Abnormal   Collection Time: 09/15/23 12:52 PM   Specimen: Stool  Result Value Ref Range Status   Calprotectin, Fecal 875 (H) 0 - 120 ug/g Final    Comment: (NOTE) **Results verified by repeat testing** Concentration     Interpretation   Follow-Up < 5 - 50 ug/g     Normal           None >50 -120 ug/g     Borderline       Re-evaluate in 4-6 weeks    >120 ug/g     Abnormal         Repeat as clinically                                   indicated Performed At: [facility] [address] [place], [place] [id number] Jennette Shorter MD Ey:[id number]   C. Diff by PCR, Reflexed     Status: Abnormal   Collection Time: 09/15/23 12:52 PM  Result Value Ref Range Status   Toxigenic C. Difficile by PCR POSITIVE (A) NEGATIVE Final    Comment: Positive for toxigenic C. difficile with little to no toxin production. Only treat if clinical presentation suggests symptomatic illness.   Hypervirulent Strain PRESUMPTIVE NEGATIVE PRESUMPTIVE NEGATIVE Final    Comment: Performed at [facility], [address], [place], [place] [address]   Labs: CBC: Recent Labs  Lab 09/19/23 1103 09/20/23 0431  WBC 5.1 6.1  NEUTROABS 2.8 4.2  HGB 10.9* 10.4*  HCT 35.3* 32.1*  MCV 92.9 93.9  PLT 157 136*   Basic Metabolic Panel: Recent Labs  Lab 09/16/23 0323 09/16/23 1445 09/17/23 2122 09/19/23 1103  09/20/23 0431  NA 137 137 138 139 136  K 3.9 3.5 3.0* 3.5 3.8  CL 112* 108 114* 113* 110  CO2 20* 21* 19* 21* 22  GLUCOSE 90 91 114* 87 89  BUN 14 12 11 8  <5*  CREATININE 0.92 0.96 0.76 0.89 0.87  CALCIUM  8.2* 8.2* 8.2* 8.4* 8.1*  MG  --   --   --  1.5* 2.2  PHOS  --   --   --  2.8 2.3*   Liver Function Tests: Recent Labs  Lab 09/19/23 1103 09/20/23 0431  AST 25 22  ALT 22 20  ALKPHOS 62 60  BILITOT 0.3 0.2  PROT 5.0* 4.6*  ALBUMIN 2.5* 2.2*   CBG: No results for input(s): GLUCAP in the last 168 hours.  Discharge time spent: greater than 30 minutes.  Signed: Alejandro Marker, DO [facility] 09/22/2023

## 2023-10-03 ENCOUNTER — Other Ambulatory Visit: Payer: Self-pay | Admitting: Gastroenterology

## 2023-10-11 ENCOUNTER — Encounter: Payer: Self-pay | Admitting: Gastroenterology

## 2023-10-11 ENCOUNTER — Ambulatory Visit: Admitting: Gastroenterology

## 2023-10-11 VITALS — BP 104/68 | HR 85 | Ht 64.0 in | Wt 147.0 lb

## 2023-10-11 DIAGNOSIS — D649 Anemia, unspecified: Secondary | ICD-10-CM

## 2023-10-11 DIAGNOSIS — Z8719 Personal history of other diseases of the digestive system: Secondary | ICD-10-CM

## 2023-10-11 DIAGNOSIS — Z8619 Personal history of other infectious and parasitic diseases: Secondary | ICD-10-CM | POA: Diagnosis not present

## 2023-10-11 DIAGNOSIS — K580 Irritable bowel syndrome with diarrhea: Secondary | ICD-10-CM

## 2023-10-11 MED ORDER — SACCHAROMYCES BOULARDII 250 MG PO CAPS: 250.0000 mg | ORAL_CAPSULE | Freq: Two times a day (BID) | ORAL | 2 refills | Status: AC

## 2023-10-11 NOTE — Patient Instructions (Addendum)
 [facility] Good Hand Hygiene Avoid Unnecessary Antibiotics Clean and Disinfect Surfaces  More information  [email or web address]  Please follow up as needed.  Thank you for entrusting me with your care and for choosing [facility], Karen May, NP   _______________________________________________________  If your blood pressure at your visit was 140/90 or greater, please contact your primary care physician to follow up on this.  _______________________________________________________  If you are age 69 or older, your body mass index should be between 23-30. Your Body mass index is 25.23 kg/m. If this is out of the aforementioned range listed, please consider follow up with your Primary Care Provider.  If you are age 23 or younger, your body mass index should be between 19-25. Your Body mass index is 25.23 kg/m. If this is out of the aformentioned range listed, please consider follow up with your Primary Care Provider.   ________________________________________________________  The [facility] providers would like to encourage you to use MYCHART to communicate with providers for non-urgent requests or questions.  Due to long hold times on the telephone, sending your provider a message by [facility] be a faster and more efficient way to get a response.  Please allow 48 business hours for a response.  Please remember that this is for non-urgent requests.  _______________________________________________________  [facility] is using a team-based approach to care.  Your team is made up of your doctor and two to three APPS. Our APPS (Nurse Practitioners and Physician Assistants) work with your physician to ensure care continuity for you. They are fully qualified to address your health concerns and develop a treatment plan. They communicate directly with your gastroenterologist to care for you. Seeing the Advanced Practice Practitioners on your  physician's team can help you by facilitating care more promptly, often allowing for earlier appointments, access to diagnostic testing, procedures, and other specialty referrals.

## 2023-10-11 NOTE — Progress Notes (Signed)
 Chief Complaint: Hospital follow-up Primary GI Doctor: Dr. Charlanne  HPI: Karen Horne is a 73 y.o. female with a history of IBS, depression, hypertension and CKD who was admitted with ongoing diarrhea and evidence of dehydration.    She was seen in the ER at [facility] on July 29 with nausea, vomiting and diarrhea with a few days duration.  She had been seen at urgent care on July 5 and diagnosed with a UTI.  She was seen again on July 16 and started on Augmentin .  She experienced worsening of her diarrhea shortly after starting the Augmentin .  Suspicion was high for C. difficile, but C. difficile testing was negative.  CT at that time showed focal thickening of the sigmoid colon, suspicious for colitis.  She had a mild acute kidney injury with mild hypokalemia.  She was discharged home with plans for outpatient follow-up.   She presented again to the ED on August 15 with ongoing diarrhea and fatigue.   In the ED she was noted to be hypotensive with positive orthostatics.  Labs notable for sodium 133, potassium 2.8, chloride 95, creatinine 1.4.  Her blood pressure proved after 1 L bolus.   GI pathogen panel on July 29 positive for enterotoxigenic E. coli, but this was felt to be unlikely to be the cause of her diarrhea given the chronicity of her symptoms, and she was not started on antibiotics.   A repeat C. difficile test during this admission showed positive antigen, but negative toxin.  Toxigenic C. difficile was detected on PCR.  Patient treated with vancomycin  125 mg 4 times daily for 10 days  Interval History    Patient presents for follow-up after recent hospitalization. She reports she is feeling much better and diarrhea has resolved.. She is having 2-3 stools per day which is her base line. She has mild abdominal discomfort in upper abdomen. Cannot pinpoint any triggers. No blood in stool.    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 20 tablet 0   cyanocobalamin 1000 MCG tablet Take 1,000 mcg by mouth daily.     escitalopram  (LEXAPRO ) 10 MG tablet Take 1 tablet by mouth daily.     ezetimibe (ZETIA) 10 MG tablet Take 1 tablet by mouth daily.     fluticasone  (FLONASE ) 50 MCG/ACT nasal spray Place 1 spray into both nostrils daily.     levothyroxine  (  SYNTHROID , LEVOTHROID) 75 MCG tablet Take 75 mcg by mouth daily before breakfast.     lisinopril  (ZESTRIL ) 10 MG tablet Take 1 tablet (10 mg total) by mouth daily. 30 tablet 0   montelukast  (SINGULAIR ) 10 MG tablet Take 1 tablet (10 mg total) by mouth at bedtime. 30 tablet 0   omeprazole  (PRILOSEC) 40 MG capsule Take 1 capsule (40 mg total) by mouth daily. For 12 weeks 90 capsule 0   potassium chloride  (KLOR-CON ) 10 MEQ tablet Take 1 tablet (10 mEq total) by mouth daily for 7 days. 7 tablet 0   saccharomyces boulardii (FLORASTOR) 250 MG capsule Take 1 capsule (250 mg total) by mouth 2 (two) times daily. 60 capsule 2   simethicone  (MYLICON) 80 MG chewable tablet Chew 1 tablet (80 mg total)  by mouth 4 (four) times daily as needed for flatulence. 30 tablet 0   ondansetron  (ZOFRAN -ODT) 4 MG disintegrating tablet Take 1 tablet (4 mg total) by mouth every 8 (eight) hours as needed for nausea or vomiting. (Patient not taking: Reported on 10/11/2023) 20 tablet 0   sodium chloride  (OCEAN) 0.65 % SOLN nasal spray Place 1 spray into both nostrils as needed for congestion. (Patient not taking: Reported on 10/11/2023) 88 mL 0   No current facility-administered medications for this visit.    Allergies as of 10/11/2023 - Review Complete 10/11/2023  Allergen Reaction Noted   Adhesive [tape] Rash 09/10/2012    Family History  Problem Relation Age of Onset   High blood pressure Mother    High blood pressure Father    High blood pressure Maternal Grandmother    High blood pressure Maternal Grandfather    High blood pressure Paternal Grandmother    High blood pressure Paternal Grandfather    Liver disease Neg Hx    Cancer - Colon Neg Hx    Esophageal cancer Neg Hx    Colon cancer Neg Hx    Stomach cancer Neg Hx    Rectal cancer Neg Hx     Review of Systems:    Constitutional: No weight loss, fever, chills, weakness or fatigue HEENT: Eyes: No change in vision               Ears, Nose, Throat:  No change in hearing or congestion Skin: No rash or itching Cardiovascular: No chest pain, chest pressure or palpitations   Respiratory: No SOB or cough Gastrointestinal: See HPI and otherwise negative Genitourinary: No dysuria or change in urinary frequency Neurological: No headache, dizziness or syncope Musculoskeletal: No new muscle or joint pain Hematologic: No bleeding or bruising Psychiatric: No history of depression or anxiety    Physical Exam:  Vital signs: BP 104/68   Pulse 85   Ht 5' 4 (1.626 m)   Wt 147 lb (66.7 kg)   BMI 25.23 kg/m   Constitutional:   Pleasant  female appears to be in NAD, Well developed, Well nourished, alert and cooperative Throat: Oral cavity and  pharynx without inflammation, swelling or lesion.  Respiratory: Respirations even and unlabored. Lungs clear to auscultation bilaterally.   No wheezes, crackles, or rhonchi.  Cardiovascular: Normal S1, S2. Regular rate and rhythm. No peripheral edema, cyanosis or pallor.  Gastrointestinal:  Soft, nondistended, nontender. No rebound or guarding. Normal bowel sounds. No appreciable masses or hepatomegaly. Rectal:  Not performed.  Msk:  Symmetrical without gross deformities. Without edema, no deformity or joint abnormality.  Neurologic:  Alert and  oriented x4;  grossly normal neurologically.  Skin:  Dry and intact without significant lesions or rashes.  RELEVANT LABS AND IMAGING: CBC    Latest Ref Rng & Units 09/20/2023    4:31 AM 09/19/2023   11:03 AM 09/14/2023    5:42 PM  CBC  WBC 4.0 - 10.5 K/uL 6.1  5.1  10.1   Hemoglobin 12.0 - 15.0 g/dL 89.5  89.0  86.6   Hematocrit 36.0 - 46.0 % 32.1  35.3  39.2   Platelets 150 - 400 K/uL 136  157  282      CMP     Latest Ref Rng & Units 09/20/2023    4:31 AM 09/19/2023   11:03 AM 09/17/2023    9:22 PM  CMP  Glucose 70 - 99 mg/dL 89  87  885   BUN 8 - 23 mg/dL 5  8  11    Creatinine 0.44 - 1.00 mg/dL 9.12  9.10  9.23   Sodium 135 - 145 mmol/L 136  139  138   Potassium 3.5 - 5.1 mmol/L 3.8  3.5  3.0   Chloride 98 - 111 mmol/L 110  113  114   CO2 22 - 32 mmol/L 22  21  19    Calcium  8.9 - 10.3 mg/dL 8.1  8.4  8.2   Total Protein 6.5 - 8.1 g/dL 4.6  5.0    Total Bilirubin 0.0 - 1.2 mg/dL 0.2  0.3    Alkaline Phos 38 - 126 U/L 60  62    AST 15 - 41 U/L 22  25    ALT 0 - 44 U/L 20  22       Lab Results  Component Value Date   TSH 0.540 09/20/2023   03/2022 colonoscopy , recall 10 years  - Mild sigmoid diverticulosis  - Otherwise normal colonoscopy to TI.  Assessment/Plan: 73 year old female aptient that presents for follow-up after recent hospitalization  C Diff Colitis and Diarrhea- resolved Had recent ETEC and repeat GI pathogen  panel was negative. Fecal calprotectin was 875, C. difficile antigen was positive and toxigenic C. difficile by PCR was positive and fecal lactoferrin qualitative was positive C/w po Vancomycin  125 mg po 4x Daily.  - Consider Fidaxomicin in the future if reoccurrence  Normocytic Anemia: Likely dilutional Drop from IVF Resuscitation; Hgb/Hct went from 12.6/38.2 -> 13.3/39.2 -> 10.9/35.3 -> 10.4/32.1 No overt bleeding noted.   Plan: -Start florastor 1 capsule twice daily -cdiff prevention education -follow up as needed   Thank you for the courtesy of this consult. Please call me with any questions or concerns.   Treysen Sudbeck, FNP-C [facility] 10/11/2023, 3:45 PM  Cc: Haze Kingfisher, MD

## 2024-01-03 ENCOUNTER — Other Ambulatory Visit: Payer: Self-pay | Admitting: Gastroenterology

## 2024-02-22 ENCOUNTER — Other Ambulatory Visit: Payer: Self-pay

## 2024-02-22 ENCOUNTER — Emergency Department (HOSPITAL_BASED_OUTPATIENT_CLINIC_OR_DEPARTMENT_OTHER): Admission: EM | Admit: 2024-02-22 | Discharge: 2024-02-22 | Disposition: A | Discharge status: Home / Self Care

## 2024-02-22 ENCOUNTER — Emergency Department (HOSPITAL_BASED_OUTPATIENT_CLINIC_OR_DEPARTMENT_OTHER): Admitting: Radiology

## 2024-02-22 DIAGNOSIS — W1839XA Other fall on same level, initial encounter: Secondary | ICD-10-CM | POA: Diagnosis not present

## 2024-02-22 DIAGNOSIS — Y92481 Parking lot as the place of occurrence of the external cause: Secondary | ICD-10-CM | POA: Insufficient documentation

## 2024-02-22 DIAGNOSIS — W19XXXA Unspecified fall, initial encounter: Secondary | ICD-10-CM

## 2024-02-22 DIAGNOSIS — I10 Essential (primary) hypertension: Secondary | ICD-10-CM | POA: Insufficient documentation

## 2024-02-22 DIAGNOSIS — R0789 Other chest pain: Secondary | ICD-10-CM

## 2024-02-22 DIAGNOSIS — R072 Precordial pain: Secondary | ICD-10-CM | POA: Insufficient documentation

## 2024-02-22 DIAGNOSIS — Z79899 Other long term (current) drug therapy: Secondary | ICD-10-CM | POA: Diagnosis not present

## 2024-02-22 MED ORDER — METHOCARBAMOL 500 MG PO TABS: 500.0000 mg | ORAL_TABLET | Freq: Two times a day (BID) | ORAL | 0 refills | Status: AC

## 2024-02-22 MED ORDER — KETOROLAC TROMETHAMINE 15 MG/ML IJ SOLN
15.0000 mg | Freq: Once | INTRAMUSCULAR | Status: AC
  Administered 2024-02-22: 15 mg via INTRAMUSCULAR
  Filled 2024-02-22: qty 1

## 2024-02-22 NOTE — ED Notes (Signed)

## 2024-02-22 NOTE — Discharge Instructions (Signed)
 It was a pleasure taking care of you today.  Your workup was reassuring.  Both of your x-rays were unremarkable and show no evidence of fracture or dislocation.  I did give you a medication called Toradol  while you are in the emergency department for pain and inflammation.  You may continue to take Tylenol  and/or ibuprofen  as needed.  You may alternate between these medications every 6 hours.  I have also sent you home with a medication called Robaxin , which is a muscle relaxer.  This should help to ease your pain after your fall.  However, I recommend only taking it at night as it can make you drowsy.  I recommend following up with your PCP in about 1 week after your fall.  Please return to the emergency department if you have any worsening symptoms or experience any life-threatening illnesses.

## 2024-02-22 NOTE — ED Provider Notes (Signed)
 " [place] EMERGENCY DEPARTMENT AT [facility] Provider Note   CSN: [id number] Arrival date & time: 02/22/24  8197     Patient presents with: Karen Horne is a 74 y.o. female past medical history of hypertension, hypercholesterolemia, adjustment disorder, GERD, who presents emergency department for evaluation after a fall.  Patient reports she was in the [place] parking lot, and was unable to get her trunk to open like normal so she knocked on the car window, and when she turned around she lost her footing causing her to fall.  She states that she landed on her elbow, knees and chest.  She is currently reporting pain around her sternum as well as her shoulder.  She has not taken any medication prior to arrival.  Patient is not on any blood thinners.  She did not hit her head and denies LOC.    Fall       Prior to Admission medications  Medication Sig Start Date End Date Taking? Authorizing Provider  methocarbamol  (ROBAXIN ) 500 MG tablet Take 1 tablet (500 mg total) by mouth 2 (two) times daily. 02/22/24  Yes Pansy Ostrovsky, Marry RAMAN, PA-C  acetaminophen  (TYLENOL ) 325 MG tablet Take 2 tablets (650 mg total) by mouth every 6 (six) hours as needed for mild pain (pain score 1-3) or fever (or Fever >/= 101). 09/20/23   Sheikh, Omair Latif, DO  cyanocobalamin 1000 MCG tablet Take 1,000 mcg by mouth daily.    [provider]  escitalopram  (LEXAPRO ) 10 MG tablet Take 1 tablet by mouth daily. 03/31/23   [provider]  ezetimibe (ZETIA) 10 MG tablet Take 1 tablet by mouth daily. 03/31/23   [provider]  fluticasone  (FLONASE ) 50 MCG/ACT nasal spray Place 1 spray into both nostrils daily.    [provider]  levothyroxine  (SYNTHROID , LEVOTHROID) 75 MCG tablet Take 75 mcg by mouth daily before breakfast.    [provider]  lisinopril  (ZESTRIL ) 10 MG tablet Take 1 tablet (10 mg total) by mouth daily. 09/20/23 09/19/24  Sherrill Cable Latif, DO   montelukast  (SINGULAIR ) 10 MG tablet Take 1 tablet (10 mg total) by mouth at bedtime. 09/20/23   Sherrill Cable Latif, DO  omeprazole  (PRILOSEC) 40 MG capsule Take 1 capsule (40 mg total) by mouth daily. For 12 weeks 10/03/23   Charlanne Groom, MD  ondansetron  (ZOFRAN -ODT) 4 MG disintegrating tablet Take 1 tablet (4 mg total) by mouth every 8 (eight) hours as needed for nausea or vomiting. Patient not taking: Reported on 10/11/2023 09/20/23   Sherrill Cable Latif, DO  potassium chloride  (KLOR-CON ) 10 MEQ tablet Take 1 tablet (10 mEq total) by mouth daily for 7 days. 09/20/23 10/11/23  Sheikh, Omair Latif, DO  saccharomyces boulardii (FLORASTOR) 250 MG capsule Take 1 capsule (250 mg total) by mouth 2 (two) times daily. 10/11/23   May, Deanna J, NP  simethicone  (MYLICON) 80 MG chewable tablet Chew 1 tablet (80 mg total) by mouth 4 (four) times daily as needed for flatulence. 09/20/23   Sherrill Cable Latif, DO  sodium chloride  (OCEAN) 0.65 % SOLN nasal spray Place 1 spray into both nostrils as needed for congestion. Patient not taking: Reported on 10/11/2023 09/20/23   Sherrill Cable Latif, DO    Allergies: Adhesive [tape]    Review of Systems  Musculoskeletal:  Positive for myalgias.    Updated Vital Signs BP 130/82 (BP Location: Left Arm)   Pulse 73   Temp 98.1 F (36.7 C)   Resp  18   SpO2 99%   Physical Exam Vitals and nursing note reviewed.  Constitutional:      Appearance: Normal appearance. She is not ill-appearing.  Eyes:     General: No scleral icterus. Pulmonary:     Effort: Pulmonary effort is normal. No respiratory distress.  Musculoskeletal:        General: Tenderness present. No deformity.     Comments: Patient with reproducible pain when palpating the sternum.  There is a small 0.5 cm abrasion to her right elbow.  Wound is hemostatic and bleeding is controlled.  Skin:    Coloration: Skin is not jaundiced.  Neurological:     General: No focal deficit present.     Mental Status:  She is alert.  Psychiatric:        Mood and Affect: Mood normal.     (all labs ordered are listed, but only abnormal results are displayed) Labs Reviewed - No data to display  EKG: None  Radiology: DG Shoulder Right Result Date: 02/22/2024 EXAM: 1 VIEW(S) XRAY OF THE RIGHT SHOULDER 02/22/2024 08:29:50 PM COMPARISON: Comparison with 08/22/2019. CLINICAL HISTORY: pain from fall. FINDINGS: BONES AND JOINTS: Glenohumeral joint is normally aligned. No evidence of acute fracture or dislocation. Degenerative changes in the right shoulder involving the acromioclavicular and glenohumeral joints. Loss of subacromial space suggests chronic rotator cuff arthropathy. Postoperative changes in the humeral head with old screw tracts and old bone deformity. Osteophyte formation off of the glenoid with possible small loose body at the superior glenoid rim. SOFT TISSUES: Soft tissues are unremarkable. No abnormal calcifications. Visualized lung is unremarkable. IMPRESSION: 1. No acute fracture or dislocation. Electronically signed by: Elsie Gravely MD 02/22/2024 08:35 PM EST RP Workstation: [id number]   DG Chest 2 View Result Date: 02/22/2024 EXAM: 2 VIEW(S) XRAY OF THE CHEST 02/22/2024 07:46:35 PM COMPARISON: Comparison with 05/04/2023. CLINICAL HISTORY: Rib pain after fall. FINDINGS: LUNGS AND PLEURA: Lungs are clear. No pleural effusion. No pneumothorax. HEART AND MEDIASTINUM: Heart size and pulmonary vascularity are normal. Mediastinal contours appear intact. BONES AND SOFT TISSUES: Visualized bones are nondisplaced. IMPRESSION: 1. No acute cardiopulmonary abnormality or pneumothorax. 2. No displaced rib fracture identified. Electronically signed by: Elsie Gravely MD 02/22/2024 07:55 PM EST RP Workstation: [id number]     Procedures   Medications Ordered in the ED  ketorolac  (TORADOL ) 15 MG/ML injection 15 mg (15 mg Intramuscular Given 02/22/24 2010)      Medical Decision Making Amount and/or  Complexity of Data Reviewed Radiology: ordered.  Risk Prescription drug management.   74 year old female who presents emergency room for evaluation of a after a fall.  Differential diagnosis: Rib fractures, sternal fracture, pneumothorax, hemothorax, laceration, hematoma, contusion.  Patient is ambulatory, pacing around the room.  I did order a chest x-ray to rule out rib fracture and pneumothorax.  This was negative.  I also ordered a dedicated right shoulder x-ray at the patient's request, as this was an area of reported pain.  There were no acute changes noted.  I personally viewed and interpreted both of these images and agree with the radiologist interpretation.  No indication for CT scan as patient did not hit her head and is not on any blood thinners.  Additional workup not indicated.  I did give the patient Toradol  for pain control and will send her home with a prescription for Robaxin  as a muscle relaxer.  Recommendation for patient to follow-up with her PCP next week.  She verbalized understanding to this.  Patient's vital  signs are stable.  Patient is appropriate for discharge at this time.    Final diagnoses:  Fall, initial encounter  Sternum pain    ED Discharge Orders          Ordered    methocarbamol  (ROBAXIN ) 500 MG tablet  2 times daily        02/22/24 2123               Torrence Marry GORMAN DEVONNA 02/22/24 2123    Simon Lavonia SAILOR, MD 02/22/24 2214  "

## 2024-02-22 NOTE — ED Triage Notes (Signed)
 Patient reports fall today in parking lot. Reports central rib pain worse with movement. Also states skinned bilateral knees. Denies hitting head. Denies blood thinner use.

## 2024-03-07 ENCOUNTER — Other Ambulatory Visit: Payer: Self-pay | Admitting: Family Medicine

## 2024-03-07 DIAGNOSIS — Z1231 Encounter for screening mammogram for malignant neoplasm of breast: Secondary | ICD-10-CM

## 2024-03-26 ENCOUNTER — Ambulatory Visit
